# Patient Record
Sex: Male | Born: 1955 | Race: White | Hispanic: No | Smoking: Current every day smoker
Health system: Southern US, Community
[De-identification: ages and names within clinical notes are randomized; demographics above are authoritative.]

## PROBLEM LIST (undated history)

## (undated) DIAGNOSIS — I739 Peripheral vascular disease, unspecified: Secondary | ICD-10-CM

## (undated) DIAGNOSIS — K8020 Calculus of gallbladder without cholecystitis without obstruction: Secondary | ICD-10-CM

## (undated) DIAGNOSIS — L570 Actinic keratosis: Secondary | ICD-10-CM

## (undated) DIAGNOSIS — K219 Gastro-esophageal reflux disease without esophagitis: Secondary | ICD-10-CM

## (undated) DIAGNOSIS — R918 Other nonspecific abnormal finding of lung field: Secondary | ICD-10-CM

## (undated) HISTORY — PX: TONSILLECTOMY: SUR1361

## (undated) HISTORY — PX: COLON SURGERY: SHX602

---

## 1985-07-13 HISTORY — PX: OTHER SURGICAL HISTORY: SHX169

## 1985-07-13 HISTORY — PX: ILEOCECETOMY: SHX5857

## 1985-07-13 HISTORY — PX: TAKE DOWN OF INTESTINAL FISTULA: SHX6107

## 1995-07-14 HISTORY — PX: PARTIAL COLECTOMY: SHX5273

## 2001-02-15 ENCOUNTER — Ambulatory Visit (HOSPITAL_COMMUNITY): Admission: RE | Admit: 2001-02-15 | Discharge: 2001-02-15 | Payer: Self-pay | Admitting: Gastroenterology

## 2001-03-01 ENCOUNTER — Encounter (HOSPITAL_COMMUNITY): Admission: RE | Admit: 2001-03-01 | Discharge: 2001-05-30 | Payer: Self-pay | Admitting: Gastroenterology

## 2002-10-25 ENCOUNTER — Encounter (HOSPITAL_COMMUNITY): Admission: RE | Admit: 2002-10-25 | Discharge: 2003-01-23 | Payer: Self-pay | Admitting: Gastroenterology

## 2003-03-28 ENCOUNTER — Encounter (HOSPITAL_COMMUNITY): Admission: RE | Admit: 2003-03-28 | Discharge: 2003-06-26 | Payer: Self-pay | Admitting: Gastroenterology

## 2016-10-06 DIAGNOSIS — Z13220 Encounter for screening for lipoid disorders: Secondary | ICD-10-CM | POA: Diagnosis not present

## 2016-10-06 DIAGNOSIS — E538 Deficiency of other specified B group vitamins: Secondary | ICD-10-CM | POA: Diagnosis not present

## 2016-10-06 DIAGNOSIS — Z23 Encounter for immunization: Secondary | ICD-10-CM | POA: Diagnosis not present

## 2016-10-06 DIAGNOSIS — Z0001 Encounter for general adult medical examination with abnormal findings: Secondary | ICD-10-CM | POA: Diagnosis not present

## 2016-10-20 ENCOUNTER — Other Ambulatory Visit: Payer: Self-pay | Admitting: Acute Care

## 2016-10-20 DIAGNOSIS — F17210 Nicotine dependence, cigarettes, uncomplicated: Principal | ICD-10-CM

## 2016-10-26 DIAGNOSIS — L570 Actinic keratosis: Secondary | ICD-10-CM | POA: Diagnosis not present

## 2016-10-26 DIAGNOSIS — L821 Other seborrheic keratosis: Secondary | ICD-10-CM | POA: Diagnosis not present

## 2016-10-30 ENCOUNTER — Ambulatory Visit: Payer: Self-pay

## 2016-10-30 ENCOUNTER — Encounter: Payer: Self-pay | Admitting: Acute Care

## 2016-11-11 ENCOUNTER — Ambulatory Visit (INDEPENDENT_AMBULATORY_CARE_PROVIDER_SITE_OTHER)
Admission: RE | Admit: 2016-11-11 | Discharge: 2016-11-11 | Disposition: A | Discharge status: Home / Self Care | Payer: BLUE CROSS/BLUE SHIELD | Source: Ambulatory Visit | Attending: Acute Care | Admitting: Acute Care

## 2016-11-11 ENCOUNTER — Ambulatory Visit (INDEPENDENT_AMBULATORY_CARE_PROVIDER_SITE_OTHER): Payer: BLUE CROSS/BLUE SHIELD | Admitting: Acute Care

## 2016-11-11 ENCOUNTER — Other Ambulatory Visit: Payer: Self-pay | Admitting: Acute Care

## 2016-11-11 ENCOUNTER — Encounter: Payer: Self-pay | Admitting: Acute Care

## 2016-11-11 DIAGNOSIS — F17210 Nicotine dependence, cigarettes, uncomplicated: Secondary | ICD-10-CM

## 2016-11-11 DIAGNOSIS — Z87891 Personal history of nicotine dependence: Secondary | ICD-10-CM

## 2016-11-11 NOTE — Progress Notes (Signed)
Shared Decision Making Visit Lung Cancer Screening Program [id number])   Eligibility:  Age 61 y.o.  Pack Years Smoking History Calculation 43.5 pack year smoking history (# packs/per year x # years smoked)  Recent History of coughing up blood  no  Unexplained weight loss? no ( >Than 15 pounds within the last 6 months )  Prior History Lung / other cancer no (Diagnosis within the last 5 years already requiring surveillance chest CT Scans).  Smoking Status Current Smoker  Former Smokers: Years since quit: Not applicable  Quit Date: Not applicable  Visit Components:  Discussion included one or more decision making aids. yes  Discussion included risk/benefits of screening. yes  Discussion included potential follow up diagnostic testing for abnormal scans. yes  Discussion included meaning and risk of over diagnosis. yes  Discussion included meaning and risk of False Positives. yes  Discussion included meaning of total radiation exposure. yes  Counseling Included:  Importance of adherence to annual lung cancer LDCT screening. yes  Impact of comorbidities on ability to participate in the program. yes  Ability and willingness to under diagnostic treatment. yes  Smoking Cessation Counseling:  Current Smokers:   Discussed importance of smoking cessation. yes  Information about tobacco cessation classes and interventions provided to patient. yes  Patient provided with "ticket" for LDCT Scan. yes  Symptomatic Patient. no  Counseling not applicable  Diagnosis Code: Tobacco Use Z72.0  Asymptomatic Patient yes  Counseling (Intermediate counseling: > three minutes counseling) U0454  Former Smokers:   Discussed the importance of maintaining cigarette abstinence. yes  Diagnosis Code: Personal History of Nicotine Dependence. U98.119  Information about tobacco cessation classes and interventions provided to patient. Yes  Patient provided with "ticket" for LDCT Scan.  yes  Written Order for Lung Cancer Screening with LDCT placed in Epic. Yes (CT Chest Lung Cancer Screening Low Dose W/O CM) [id number] Z12.2-Screening of respiratory organs Z87.891-Personal history of nicotine dependence  I have spent 25 minutes of face to face time with Calvin Brooks discussing the risks and benefits of lung cancer screening. We viewed a power point together that explained in detail the above noted topics. We paused at intervals to allow for questions to be asked and answered to ensure understanding.We discussed that the single most powerful action that he can take to decrease his risk of developing lung cancer is to quit smoking. We discussed whether or not he is ready to commit to setting a quit date. He is currently not ready to set a quit date. We discussed options for tools to aid in quitting smoking including nicotine replacement therapy, non-nicotine medications, support groups, Quit Smart classes, and behavior modification. We discussed that often times setting smaller, more achievable goals, such as eliminating 1 cigarette a day for a week and then 2 cigarettes a day for a week can be helpful in slowly decreasing the number of cigarettes smoked. This allows for a sense of accomplishment as well as providing a clinical benefit. I gave Calvin Brooks the " Be Stronger Than Your Excuses" card with contact information for community resources, classes, free nicotine replacement therapy, and access to mobile apps, text messaging, and on-line smoking cessation help. I have also given him my card and contact information in the event he needs to contact me. We discussed the time and location of the scan, and that either Abigail Miyamoto RN or I will call with the results once we receive them. I have offered Calvin Brooks  a copy of the power  point we viewed  as a resource in the event they need reinforcement of the concepts we discussed today in the office. The patient verbalized understanding of all of   the above and had no further questions upon leaving the office. They have my contact information in the event they have any further questions.  I spent 4 minutes counseling on smoking cessation and the health risks of continued tobacco abuse.  I explained to the patient that there has been a high incidence of coronary artery disease noted on these exams. I explained that this is a non-gated exam therefore degree or severity cannot be determined. This patient is currently on cholestyramine , but not statin therapy for elevated cholesterol I have asked the patient to follow-up with their PCP regarding any incidental finding of coronary artery disease and management with diet or medication as their PCP  feels is clinically indicated. The patient verbalized understanding of the above and had no further questions upon completion of the visit.       Bevelyn Ngo, NP 11/11/2016

## 2016-11-12 DIAGNOSIS — E538 Deficiency of other specified B group vitamins: Secondary | ICD-10-CM | POA: Diagnosis not present

## 2016-11-12 DIAGNOSIS — K5000 Crohn's disease of small intestine without complications: Secondary | ICD-10-CM | POA: Diagnosis not present

## 2016-11-13 ENCOUNTER — Other Ambulatory Visit: Payer: Self-pay | Admitting: Gastroenterology

## 2016-12-16 ENCOUNTER — Encounter (HOSPITAL_COMMUNITY): Payer: Self-pay | Admitting: *Deleted

## 2016-12-21 ENCOUNTER — Ambulatory Visit (HOSPITAL_COMMUNITY): Payer: BLUE CROSS/BLUE SHIELD | Admitting: Anesthesiology

## 2016-12-21 ENCOUNTER — Encounter (HOSPITAL_COMMUNITY)
Admission: RE | Disposition: A | Discharge status: Home / Self Care | Payer: Self-pay | Source: Ambulatory Visit | Attending: Gastroenterology

## 2016-12-21 ENCOUNTER — Encounter (HOSPITAL_COMMUNITY): Payer: Self-pay | Admitting: Anesthesiology

## 2016-12-21 ENCOUNTER — Ambulatory Visit (HOSPITAL_COMMUNITY)
Admission: RE | Admit: 2016-12-21 | Discharge: 2016-12-21 | Disposition: A | Discharge status: Home / Self Care | Payer: BLUE CROSS/BLUE SHIELD | Source: Ambulatory Visit | Attending: Gastroenterology | Admitting: Gastroenterology

## 2016-12-21 DIAGNOSIS — E538 Deficiency of other specified B group vitamins: Secondary | ICD-10-CM | POA: Diagnosis not present

## 2016-12-21 DIAGNOSIS — K50813 Crohn's disease of both small and large intestine with fistula: Secondary | ICD-10-CM | POA: Diagnosis not present

## 2016-12-21 DIAGNOSIS — Z880 Allergy status to penicillin: Secondary | ICD-10-CM | POA: Diagnosis not present

## 2016-12-21 DIAGNOSIS — K5000 Crohn's disease of small intestine without complications: Secondary | ICD-10-CM | POA: Diagnosis not present

## 2016-12-21 DIAGNOSIS — F17210 Nicotine dependence, cigarettes, uncomplicated: Secondary | ICD-10-CM | POA: Insufficient documentation

## 2016-12-21 DIAGNOSIS — Z1211 Encounter for screening for malignant neoplasm of colon: Secondary | ICD-10-CM | POA: Diagnosis not present

## 2016-12-21 DIAGNOSIS — K219 Gastro-esophageal reflux disease without esophagitis: Secondary | ICD-10-CM | POA: Diagnosis not present

## 2016-12-21 HISTORY — PX: COLONOSCOPY WITH PROPOFOL: SHX5780

## 2016-12-21 HISTORY — DX: Gastro-esophageal reflux disease without esophagitis: K21.9

## 2016-12-21 HISTORY — DX: Other nonspecific abnormal finding of lung field: R91.8

## 2016-12-21 HISTORY — DX: Actinic keratosis: L57.0

## 2016-12-21 HISTORY — DX: Calculus of gallbladder without cholecystitis without obstruction: K80.20

## 2016-12-21 HISTORY — DX: Peripheral vascular disease, unspecified: I73.9

## 2016-12-21 SURGERY — COLONOSCOPY WITH PROPOFOL
Anesthesia: Monitor Anesthesia Care

## 2016-12-21 MED ORDER — MIDAZOLAM HCL 2 MG/2ML IJ SOLN
INTRAMUSCULAR | Status: AC
  Filled 2016-12-21: qty 2

## 2016-12-21 MED ORDER — PROPOFOL 10 MG/ML IV BOLUS
INTRAVENOUS | Status: AC
  Filled 2016-12-21: qty 20

## 2016-12-21 MED ORDER — PROPOFOL 500 MG/50ML IV EMUL
INTRAVENOUS | Status: DC | PRN
  Administered 2016-12-21: 125 ug/kg/min via INTRAVENOUS

## 2016-12-21 MED ORDER — LACTATED RINGERS IV SOLN
INTRAVENOUS | Status: DC
  Administered 2016-12-21: 13:00:00 via INTRAVENOUS

## 2016-12-21 MED ORDER — PROPOFOL 500 MG/50ML IV EMUL
INTRAVENOUS | Status: DC | PRN
  Administered 2016-12-21: 50 mg via INTRAVENOUS

## 2016-12-21 MED ORDER — MIDAZOLAM HCL 5 MG/5ML IJ SOLN
INTRAMUSCULAR | Status: DC | PRN
  Administered 2016-12-21: 2 mg via INTRAVENOUS

## 2016-12-21 MED ORDER — SODIUM CHLORIDE 0.9 % IV SOLN: INTRAVENOUS | Status: DC

## 2016-12-21 SURGICAL SUPPLY — 21 items

## 2016-12-21 NOTE — Op Note (Signed)
[facility] Patient Name: Calvin Brooks Procedure Date: 12/21/2016 MRN: [id number] Attending MD: Charolett Bumpers , MD Date of Birth: 02-28-1956 CSN: [id number] Age: 61 Admit Type: Outpatient Procedure:                Colonoscopy Indications:              Screening for colorectal malignant neoplasm.                            Crohn's ileocolitis was diagnosed in 43. 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 Terminal ilial resection was performed in                            1998. Normal screening colonoscopy was performed in                            1987. Mercaptopurine was stopped due to elevated                            liver transaminases. Vitamin B 12 deficiency. Providers:                Charolett Bumpers, MD, Omelia Blackwater RN, RN,                            Zoila Shutter, Technician Referring MD:              Medicines:                Propofol per Anesthesia Complications:            No immediate complications. Estimated Blood Loss:     Estimated blood loss: none. Procedure:                Pre-Anesthesia Assessment:                           - Prior to the procedure, a History and Physical                            was performed, and patient medications and                            allergies were reviewed. The patient's tolerance of                            previous anesthesia was also reviewed. The risks  and benefits of the procedure and the sedation                            options and risks were discussed with the patient.                            All questions were answered, and informed consent                            was obtained. Prior Anticoagulants: The patient has                            taken no previous anticoagulant or antiplatelet                agents. ASA Grade Assessment: II - A patient with                            mild systemic disease. After reviewing the risks                            and benefits, the patient was deemed in                            satisfactory condition to undergo the procedure.                           After obtaining informed consent, the colonoscope                            was passed under direct vision. Throughout the                            procedure, the patient's blood pressure, pulse, and                            oxygen saturations were monitored continuously. The                            [id number] ([id number]) scope was introduced through                            the anus and advanced to the the ileocolonic                            anastomosis. The colonoscopy was performed without                            difficulty. The patient tolerated the procedure                            well. The quality of the bowel preparation was                            good. The  rectum was photographed. Scope In: 1:31:43 PM Scope Out: 1:52:55 PM Total Procedure Duration: 0 hours 21 minutes 12 seconds  Findings:      The perianal and digital rectal examinations were normal.      The entire examined colon appeared normal. The ileo-right colonic       surgical anastomosis was severely stenosed. I could not traverse the       strictured surgical anastomosis to examine the neoterminal ileum. Impression:               - The entire examined colon is normal. The                            ileo-right colonic surgical anastomosis is severely                            stenosed.                           - No specimens collected. Moderate Sedation:      N/A- Per Anesthesia Care Recommendation:           - Patient has a contact number available for                            emergencies. The signs and symptoms of potential                            delayed complications were discussed with  the                            patient. Return to normal activities tomorrow.                            Written discharge instructions were provided to the                            patient.                           - Repeat colonoscopy in 10 years for screening                            purposes.                           - Resume previous diet.                           - Continue present medications. Procedure Code(s):        --- Professional ---                           Z6109, Colorectal cancer screening; colonoscopy on                            individual not meeting criteria for high risk Diagnosis Code(s):        --- Professional ---  Z12.11, Encounter for screening for malignant                            neoplasm of colon CPT copyright 2016 [employer]. All rights reserved. The codes documented in this report are preliminary and upon coder review may  be revised to meet current compliance requirements. Calvin EdgeMartin Johnson, MD Charolett BumpersMartin K Johnson, MD 12/21/2016 2:00:51 PM This report has been signed electronically. Number of Addenda: 0

## 2016-12-21 NOTE — Transfer of Care (Signed)
Immediate Anesthesia Transfer of Care Note  Patient: Calvin Brooks  Procedure(s) Performed: Procedure(s): COLONOSCOPY WITH PROPOFOL (N/A)  Patient Location: PACU  Anesthesia Type:MAC  Level of Consciousness:  sedated, patient cooperative and responds to stimulation  Airway & Oxygen Therapy:Patient Spontanous Breathing and Patient connected to face mask oxgen  Post-op Assessment:  Report given to PACU RN and Post -op Vital signs reviewed and stable  Post vital signs:  Reviewed and stable  Last Vitals:  Vitals:   12/21/16 1235  BP: (!) 156/89  Pulse: 90  Resp: 12  Temp: 92.4 C    Complications: No apparent anesthesia complications

## 2016-12-21 NOTE — Anesthesia Preprocedure Evaluation (Addendum)
Anesthesia Evaluation  Patient identified by MRN, date of birth, ID band Patient awake    Reviewed: Allergy & Precautions, NPO status , Patient's Chart, lab work & pertinent test results  Airway Mallampati: II  TM Distance: >3 FB Neck ROM: Full    Dental no notable dental hx. (+) Dental Advisory Given   Pulmonary Current Smoker,    Pulmonary exam normal        Cardiovascular negative cardio ROS Normal cardiovascular exam     Neuro/Psych negative neurological ROS  negative psych ROS   GI/Hepatic Neg liver ROS, GERD  ,  Endo/Other  negative endocrine ROS  Renal/GU negative Renal ROS  negative genitourinary   Musculoskeletal negative musculoskeletal ROS (+)   Abdominal   Peds negative pediatric ROS (+)  Hematology negative hematology ROS (+)   Anesthesia Other Findings   Reproductive/Obstetrics negative OB ROS                            Anesthesia Physical Anesthesia Plan  ASA: II  Anesthesia Plan: MAC   Post-op Pain Management:    Induction:   PONV Risk Score and Plan:   Airway Management Planned: Natural Airway and Simple Face Mask  Additional Equipment:   Intra-op Plan:   Post-operative Plan:   Informed Consent: I have reviewed the patients History and Physical, chart, labs and discussed the procedure including the risks, benefits and alternatives for the proposed anesthesia with the patient or authorized representative who has indicated his/her understanding and acceptance.   Dental advisory given  Plan Discussed with: CRNA, Anesthesiologist and Surgeon  Anesthesia Plan Comments:        Anesthesia Quick Evaluation

## 2016-12-21 NOTE — Anesthesia Postprocedure Evaluation (Signed)
Anesthesia Post Note  Patient: Calvin Brooks  Procedure(s) Performed: Procedure(s) (LRB): COLONOSCOPY WITH PROPOFOL (N/A)     Patient location during evaluation: Endoscopy Anesthesia Type: MAC Level of consciousness: awake and alert Pain management: pain level controlled Vital Signs Assessment: post-procedure vital signs reviewed and stable Respiratory status: spontaneous breathing and respiratory function stable Cardiovascular status: stable Anesthetic complications: no    Last Vitals:  Vitals:   12/21/16 1410 12/21/16 1420  BP: (!) 138/99 134/60  Pulse: 86 91  Resp: 20 14  Temp:      Last Pain:  Vitals:   12/21/16 1359  TempSrc: Oral                 Kaiden Dardis DANIEL

## 2016-12-21 NOTE — Discharge Instructions (Signed)

## 2016-12-21 NOTE — H&P (Signed)
Procedure: Screening colonoscopy. Crohn's ileitis was diagnosed in [address]1987. Normal screening colonoscopy was performed in 1987. Terminal ileum and cecal resection was performed in 1987 to treat Crohn's ileitis. Terminal ilial resection was performed in 1998. Mercaptopurine was stopped due to elevated liver transaminases. Vitamin B 12 deficiency. 2.6 cm gallstone.  History: The patient is a 61 year old male born Aug 27, 1955. He was diagnosed with Crohn's ileitis in 1987. He no longer takes maintenance therapy for Crohn's disease. He takes cholestyramine to prevent diarrhea.  He is scheduled to undergo a screening colonoscopy today.  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 Vitamin B 12 deficiency. Tonsillectomy. Terminal ileum and cecum removed 1987. Terminal ilial resection performed in 1998.  Medication allergies: Penicillin  Exam: The patient is alert and lying comfortably on the endoscopy stretcher. Abdomen is soft and nontender to palpation. Lungs are clear to auscultation. Cardiac exam reveals a regular rhythm.  Plan: Proceed with screening colonoscopy

## 2016-12-22 ENCOUNTER — Encounter (HOSPITAL_COMMUNITY): Payer: Self-pay | Admitting: Gastroenterology

## 2017-06-08 ENCOUNTER — Other Ambulatory Visit: Payer: Self-pay | Admitting: Gastroenterology

## 2017-06-08 DIAGNOSIS — K5000 Crohn's disease of small intestine without complications: Secondary | ICD-10-CM | POA: Diagnosis not present

## 2017-06-08 DIAGNOSIS — K50019 Crohn's disease of small intestine with unspecified complications: Secondary | ICD-10-CM

## 2017-06-14 ENCOUNTER — Other Ambulatory Visit: Payer: BLUE CROSS/BLUE SHIELD

## 2017-06-14 ENCOUNTER — Ambulatory Visit
Admission: RE | Admit: 2017-06-14 | Discharge: 2017-06-14 | Disposition: A | Discharge status: Home / Self Care | Payer: BLUE CROSS/BLUE SHIELD | Source: Ambulatory Visit | Attending: Gastroenterology | Admitting: Gastroenterology

## 2017-06-14 DIAGNOSIS — K8020 Calculus of gallbladder without cholecystitis without obstruction: Secondary | ICD-10-CM | POA: Diagnosis not present

## 2017-06-14 DIAGNOSIS — K50019 Crohn's disease of small intestine with unspecified complications: Secondary | ICD-10-CM

## 2017-06-14 MED ORDER — IOPAMIDOL (ISOVUE-300) INJECTION 61%
125.0000 mL | Freq: Once | INTRAVENOUS | Status: AC | PRN
  Administered 2017-06-14: 125 mL via INTRAVENOUS

## 2017-11-12 ENCOUNTER — Ambulatory Visit (INDEPENDENT_AMBULATORY_CARE_PROVIDER_SITE_OTHER)
Admission: RE | Admit: 2017-11-12 | Discharge: 2017-11-12 | Disposition: A | Discharge status: Home / Self Care | Payer: BLUE CROSS/BLUE SHIELD | Source: Ambulatory Visit | Attending: Acute Care | Admitting: Acute Care

## 2017-11-12 DIAGNOSIS — F17210 Nicotine dependence, cigarettes, uncomplicated: Secondary | ICD-10-CM

## 2017-11-12 DIAGNOSIS — Z87891 Personal history of nicotine dependence: Secondary | ICD-10-CM | POA: Diagnosis not present

## 2017-11-16 DIAGNOSIS — K5000 Crohn's disease of small intestine without complications: Secondary | ICD-10-CM | POA: Diagnosis not present

## 2017-11-26 ENCOUNTER — Other Ambulatory Visit: Payer: Self-pay | Admitting: Acute Care

## 2017-11-26 DIAGNOSIS — F17210 Nicotine dependence, cigarettes, uncomplicated: Principal | ICD-10-CM

## 2017-11-26 DIAGNOSIS — Z122 Encounter for screening for malignant neoplasm of respiratory organs: Secondary | ICD-10-CM

## 2018-03-07 ENCOUNTER — Other Ambulatory Visit: Payer: Self-pay | Admitting: Gastroenterology

## 2018-03-07 DIAGNOSIS — K5000 Crohn's disease of small intestine without complications: Secondary | ICD-10-CM | POA: Diagnosis not present

## 2018-03-07 DIAGNOSIS — R634 Abnormal weight loss: Secondary | ICD-10-CM

## 2018-03-07 DIAGNOSIS — K50019 Crohn's disease of small intestine with unspecified complications: Secondary | ICD-10-CM

## 2018-03-15 ENCOUNTER — Ambulatory Visit
Admission: RE | Admit: 2018-03-15 | Discharge: 2018-03-15 | Disposition: A | Discharge status: Home / Self Care | Payer: BLUE CROSS/BLUE SHIELD | Source: Ambulatory Visit | Attending: Gastroenterology | Admitting: Gastroenterology

## 2018-03-15 DIAGNOSIS — K8020 Calculus of gallbladder without cholecystitis without obstruction: Secondary | ICD-10-CM | POA: Diagnosis not present

## 2018-03-15 DIAGNOSIS — R634 Abnormal weight loss: Secondary | ICD-10-CM

## 2018-03-15 DIAGNOSIS — K50019 Crohn's disease of small intestine with unspecified complications: Secondary | ICD-10-CM

## 2018-03-15 MED ORDER — IOPAMIDOL (ISOVUE-300) INJECTION 61%
100.0000 mL | Freq: Once | INTRAVENOUS | Status: AC | PRN
  Administered 2018-03-15: 100 mL via INTRAVENOUS

## 2018-05-25 ENCOUNTER — Ambulatory Visit (INDEPENDENT_AMBULATORY_CARE_PROVIDER_SITE_OTHER)
Admission: RE | Admit: 2018-05-25 | Discharge: 2018-05-25 | Disposition: A | Discharge status: Home / Self Care | Payer: BLUE CROSS/BLUE SHIELD | Source: Ambulatory Visit | Attending: Acute Care | Admitting: Acute Care

## 2018-05-25 DIAGNOSIS — Z122 Encounter for screening for malignant neoplasm of respiratory organs: Secondary | ICD-10-CM

## 2018-05-25 DIAGNOSIS — F17210 Nicotine dependence, cigarettes, uncomplicated: Secondary | ICD-10-CM

## 2018-05-25 DIAGNOSIS — R911 Solitary pulmonary nodule: Secondary | ICD-10-CM | POA: Diagnosis not present

## 2018-06-02 ENCOUNTER — Telehealth: Payer: Self-pay | Admitting: Acute Care

## 2018-06-02 DIAGNOSIS — Z122 Encounter for screening for malignant neoplasm of respiratory organs: Secondary | ICD-10-CM

## 2018-06-02 DIAGNOSIS — F17210 Nicotine dependence, cigarettes, uncomplicated: Principal | ICD-10-CM

## 2018-06-02 DIAGNOSIS — Z87891 Personal history of nicotine dependence: Secondary | ICD-10-CM

## 2018-06-03 NOTE — Telephone Encounter (Signed)
Pt informed of CT results per Kandice RobinsonsSarah Groce, NP.  PT verbalized understanding.  Copy sent to PCP.  Order placed for 1 yr f/u CT. Copy mailed to pt.

## 2018-06-13 DIAGNOSIS — K5000 Crohn's disease of small intestine without complications: Secondary | ICD-10-CM | POA: Diagnosis not present

## 2018-06-30 DIAGNOSIS — K5000 Crohn's disease of small intestine without complications: Secondary | ICD-10-CM | POA: Diagnosis not present

## 2018-08-29 DIAGNOSIS — K5000 Crohn's disease of small intestine without complications: Secondary | ICD-10-CM | POA: Diagnosis not present

## 2019-01-30 DIAGNOSIS — Z0120 Encounter for dental examination and cleaning without abnormal findings: Secondary | ICD-10-CM | POA: Diagnosis not present

## 2019-03-03 ENCOUNTER — Other Ambulatory Visit: Payer: Self-pay

## 2019-03-03 DIAGNOSIS — Z20822 Contact with and (suspected) exposure to covid-19: Secondary | ICD-10-CM

## 2019-03-04 LAB — NOVEL CORONAVIRUS, NAA: SARS-CoV-2, NAA: NOT DETECTED

## 2019-03-07 ENCOUNTER — Telehealth: Payer: Self-pay | Admitting: General Practice

## 2019-03-07 NOTE — Telephone Encounter (Signed)
Patient informed of negative covid-19 result. Patient verbalized understanding.  °

## 2019-06-05 DIAGNOSIS — Z0120 Encounter for dental examination and cleaning without abnormal findings: Secondary | ICD-10-CM | POA: Diagnosis not present

## 2019-06-14 ENCOUNTER — Ambulatory Visit
Admission: RE | Admit: 2019-06-14 | Discharge: 2019-06-14 | Disposition: A | Discharge status: Home / Self Care | Payer: BLUE CROSS/BLUE SHIELD | Source: Ambulatory Visit | Attending: Acute Care | Admitting: Acute Care

## 2019-06-14 DIAGNOSIS — F17210 Nicotine dependence, cigarettes, uncomplicated: Secondary | ICD-10-CM | POA: Diagnosis not present

## 2019-06-14 DIAGNOSIS — Z87891 Personal history of nicotine dependence: Secondary | ICD-10-CM

## 2019-06-14 DIAGNOSIS — Z122 Encounter for screening for malignant neoplasm of respiratory organs: Secondary | ICD-10-CM

## 2019-06-15 DIAGNOSIS — K50012 Crohn's disease of small intestine with intestinal obstruction: Secondary | ICD-10-CM | POA: Diagnosis not present

## 2019-06-16 DIAGNOSIS — K50012 Crohn's disease of small intestine with intestinal obstruction: Secondary | ICD-10-CM | POA: Diagnosis not present

## 2019-06-19 ENCOUNTER — Other Ambulatory Visit: Payer: Self-pay | Admitting: *Deleted

## 2019-06-19 DIAGNOSIS — Z87891 Personal history of nicotine dependence: Secondary | ICD-10-CM

## 2019-06-19 DIAGNOSIS — F17210 Nicotine dependence, cigarettes, uncomplicated: Secondary | ICD-10-CM

## 2019-06-19 DIAGNOSIS — Z122 Encounter for screening for malignant neoplasm of respiratory organs: Secondary | ICD-10-CM

## 2019-10-02 DIAGNOSIS — Z0120 Encounter for dental examination and cleaning without abnormal findings: Secondary | ICD-10-CM | POA: Diagnosis not present

## 2020-02-05 DIAGNOSIS — Z0120 Encounter for dental examination and cleaning without abnormal findings: Secondary | ICD-10-CM | POA: Diagnosis not present

## 2020-06-03 DIAGNOSIS — Z8719 Personal history of other diseases of the digestive system: Secondary | ICD-10-CM | POA: Diagnosis not present

## 2020-06-03 DIAGNOSIS — Z0120 Encounter for dental examination and cleaning without abnormal findings: Secondary | ICD-10-CM | POA: Diagnosis not present

## 2020-06-03 DIAGNOSIS — E538 Deficiency of other specified B group vitamins: Secondary | ICD-10-CM | POA: Diagnosis not present

## 2020-06-20 ENCOUNTER — Ambulatory Visit
Admission: RE | Admit: 2020-06-20 | Discharge: 2020-06-20 | Disposition: A | Discharge status: Home / Self Care | Payer: Medicare Other | Source: Ambulatory Visit | Attending: Acute Care | Admitting: Acute Care

## 2020-06-20 DIAGNOSIS — F17210 Nicotine dependence, cigarettes, uncomplicated: Secondary | ICD-10-CM | POA: Diagnosis not present

## 2020-06-20 DIAGNOSIS — J432 Centrilobular emphysema: Secondary | ICD-10-CM | POA: Diagnosis not present

## 2020-06-20 DIAGNOSIS — D3500 Benign neoplasm of unspecified adrenal gland: Secondary | ICD-10-CM | POA: Diagnosis not present

## 2020-06-20 DIAGNOSIS — I2510 Atherosclerotic heart disease of native coronary artery without angina pectoris: Secondary | ICD-10-CM | POA: Diagnosis not present

## 2020-06-20 DIAGNOSIS — Z87891 Personal history of nicotine dependence: Secondary | ICD-10-CM

## 2020-06-20 DIAGNOSIS — Z122 Encounter for screening for malignant neoplasm of respiratory organs: Secondary | ICD-10-CM

## 2020-06-27 ENCOUNTER — Other Ambulatory Visit: Payer: Self-pay | Admitting: *Deleted

## 2020-06-27 DIAGNOSIS — F17210 Nicotine dependence, cigarettes, uncomplicated: Secondary | ICD-10-CM

## 2020-06-27 DIAGNOSIS — Z87891 Personal history of nicotine dependence: Secondary | ICD-10-CM

## 2020-06-27 NOTE — Progress Notes (Signed)
Please call patient and let them  know their  low dose Ct was read as a Lung RADS 2: nodules that are benign in appearance and behavior with a very low likelihood of becoming a clinically active cancer due to size or lack of growth. Recommendation per radiology is for a repeat LDCT in 12 months. .Please let them  know we will order and schedule their  annual screening scan for 06/2021. Please let them  know there was notation of CAD on their  scan.  Please remind the patient  that this is a non-gated exam therefore degree or severity of disease  cannot be determined. Please have them  follow up with their PCP regarding potential risk factor modification, dietary therapy or pharmacologic therapy if clinically indicated. Pt.  is  not currently on statin therapy. Please place order for annual  screening scan for  06/2021 and fax results to PCP. Thanks so much.  Angelique Blonder, this patient has significant CAD per scan. They are not on statin, and I do not see any cardiology notes within Epic. Marland Kitchen Please Have them follow up with their PCP about referral to cards. Thanks so much

## 2020-11-27 ENCOUNTER — Other Ambulatory Visit (HOSPITAL_COMMUNITY): Payer: Self-pay | Admitting: Gastroenterology

## 2020-11-27 ENCOUNTER — Other Ambulatory Visit: Payer: Self-pay | Admitting: Gastroenterology

## 2020-11-27 DIAGNOSIS — R110 Nausea: Secondary | ICD-10-CM | POA: Diagnosis not present

## 2020-11-27 DIAGNOSIS — K50012 Crohn's disease of small intestine with intestinal obstruction: Secondary | ICD-10-CM | POA: Diagnosis not present

## 2020-11-27 DIAGNOSIS — R1084 Generalized abdominal pain: Secondary | ICD-10-CM | POA: Diagnosis not present

## 2020-12-03 ENCOUNTER — Other Ambulatory Visit: Payer: Self-pay

## 2020-12-03 ENCOUNTER — Ambulatory Visit (HOSPITAL_COMMUNITY)
Admission: RE | Admit: 2020-12-03 | Discharge: 2020-12-03 | Disposition: A | Discharge status: Home / Self Care | Payer: Medicare Other | Source: Ambulatory Visit | Attending: Gastroenterology | Admitting: Gastroenterology

## 2020-12-03 DIAGNOSIS — K50012 Crohn's disease of small intestine with intestinal obstruction: Secondary | ICD-10-CM | POA: Insufficient documentation

## 2020-12-03 DIAGNOSIS — R1084 Generalized abdominal pain: Secondary | ICD-10-CM | POA: Diagnosis not present

## 2020-12-03 DIAGNOSIS — R109 Unspecified abdominal pain: Secondary | ICD-10-CM | POA: Diagnosis not present

## 2020-12-03 MED ORDER — IOHEXOL 300 MG/ML  SOLN
100.0000 mL | Freq: Once | INTRAMUSCULAR | Status: AC | PRN
  Administered 2020-12-03: 100 mL via INTRAVENOUS

## 2020-12-03 MED ORDER — BARIUM SULFATE 0.1 % PO SUSP
ORAL | Status: AC
  Filled 2020-12-03: qty 3

## 2020-12-03 MED ORDER — SODIUM CHLORIDE (PF) 0.9 % IJ SOLN
INTRAMUSCULAR | Status: AC
  Filled 2020-12-03: qty 50

## 2020-12-19 DIAGNOSIS — K50012 Crohn's disease of small intestine with intestinal obstruction: Secondary | ICD-10-CM | POA: Diagnosis not present

## 2020-12-26 DIAGNOSIS — K50012 Crohn's disease of small intestine with intestinal obstruction: Secondary | ICD-10-CM | POA: Diagnosis not present

## 2021-01-02 DIAGNOSIS — K50012 Crohn's disease of small intestine with intestinal obstruction: Secondary | ICD-10-CM | POA: Diagnosis not present

## 2021-01-09 DIAGNOSIS — K50012 Crohn's disease of small intestine with intestinal obstruction: Secondary | ICD-10-CM | POA: Diagnosis not present

## 2021-01-16 DIAGNOSIS — K50012 Crohn's disease of small intestine with intestinal obstruction: Secondary | ICD-10-CM | POA: Diagnosis not present

## 2021-02-04 DIAGNOSIS — K50012 Crohn's disease of small intestine with intestinal obstruction: Secondary | ICD-10-CM | POA: Diagnosis not present

## 2021-03-10 DIAGNOSIS — K50012 Crohn's disease of small intestine with intestinal obstruction: Secondary | ICD-10-CM | POA: Diagnosis not present

## 2021-04-10 DIAGNOSIS — K5000 Crohn's disease of small intestine without complications: Secondary | ICD-10-CM | POA: Diagnosis not present

## 2021-09-06 IMAGING — CT CT CHEST LUNG CANCER SCREENING LOW DOSE W/O CM
2 of 5 series · 14 of 40 positions shown, 17 images · non-contrast
Comparison: Low-dose lung cancer screening chest CT 06/14/2019.

CLINICAL DATA: 64-year-old male current smoker with 46 pack-year
history of smoking. Lung cancer screening examination.

EXAM:
CT CHEST WITHOUT CONTRAST LOW-DOSE FOR LUNG CANCER SCREENING
TECHNIQUE: Multidetector CT imaging of the chest was performed following the
standard protocol without IV contrast.

[Series 4: lung 1.00 br44 cor · coronal · 0.72mm/px · 3 of 317 slices shown]
[im 64/317  lung]
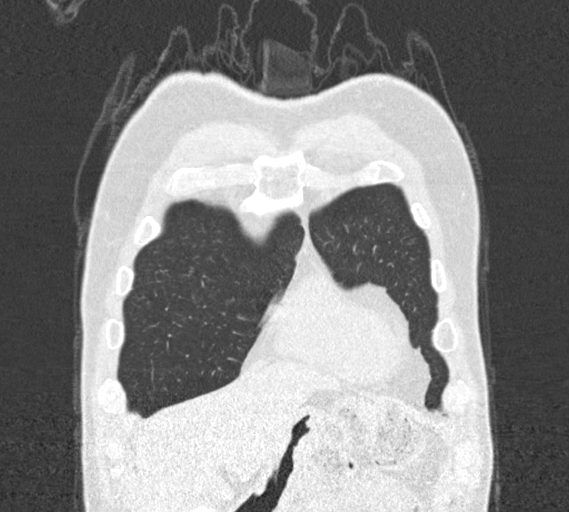
[im 127/317  lung]
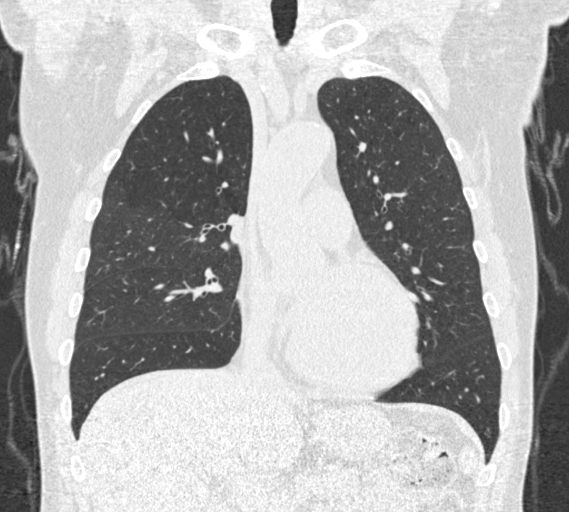
[im 190/317  lung]
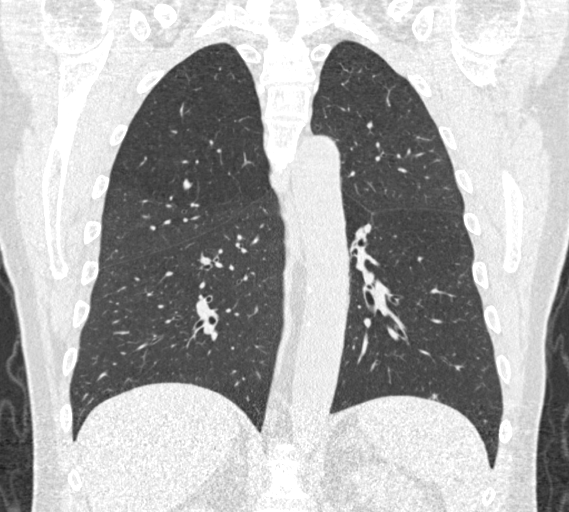

[Series 9: lung 1.00 br60 axial · axial · 0.80mm/px · z∈[-931,-599]mm · 11 of 368 slices shown, 14 images]
[im 18/368  mediastinal]
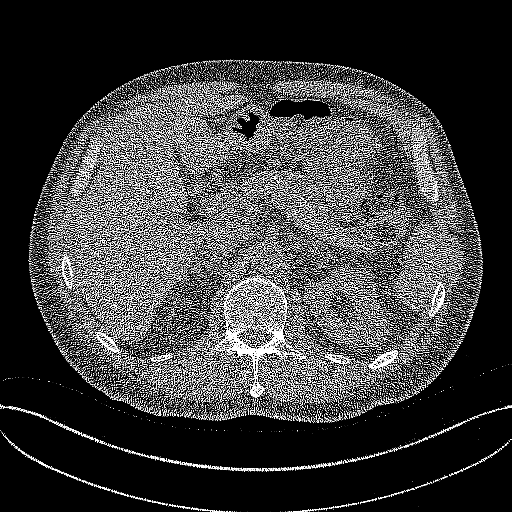
[im 18/368  lung]
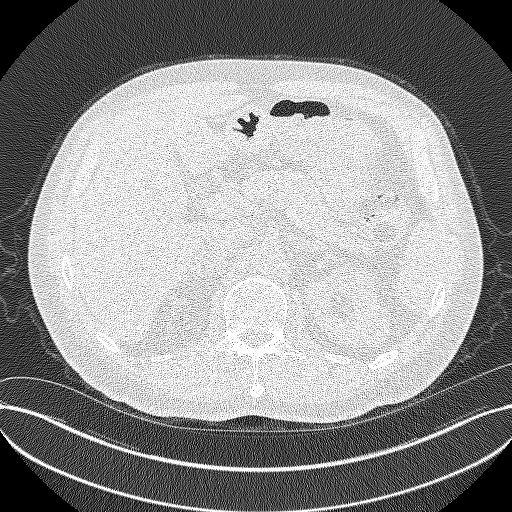
[im 53/368  lung]
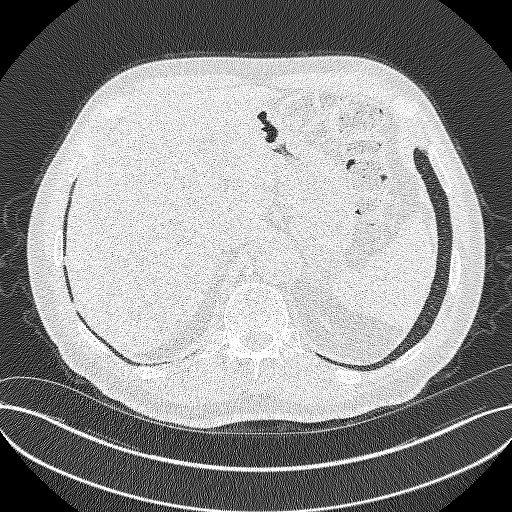
[im 88/368  lung]
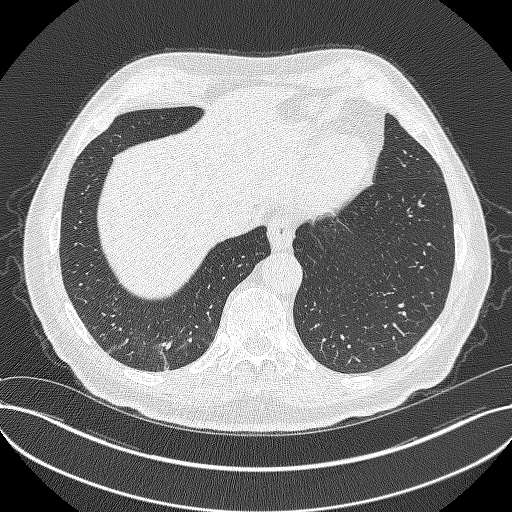
[im 123/368  lung]
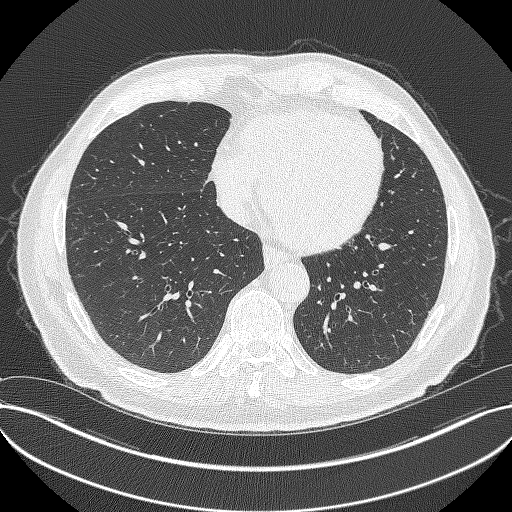
[im 158/368  mediastinal]
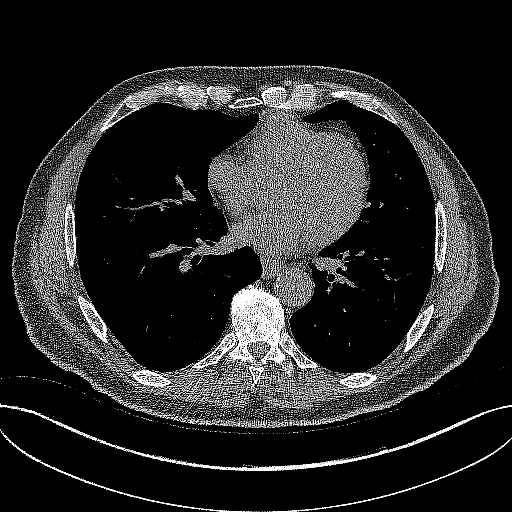
[im 158/368  lung]
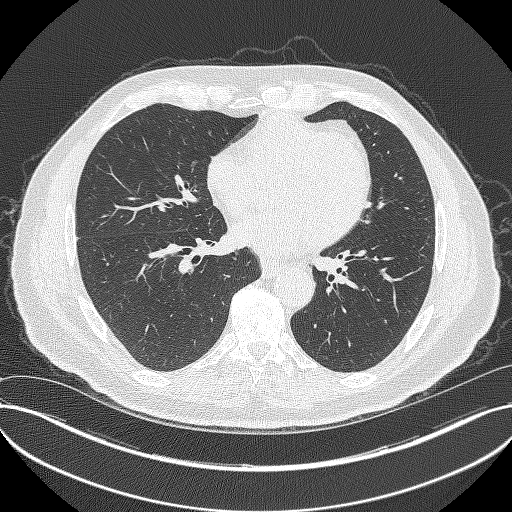
[im 193/368  lung]
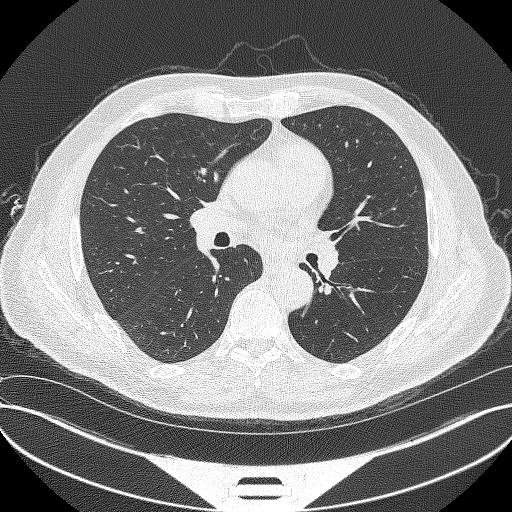
[im 210/368  lung]
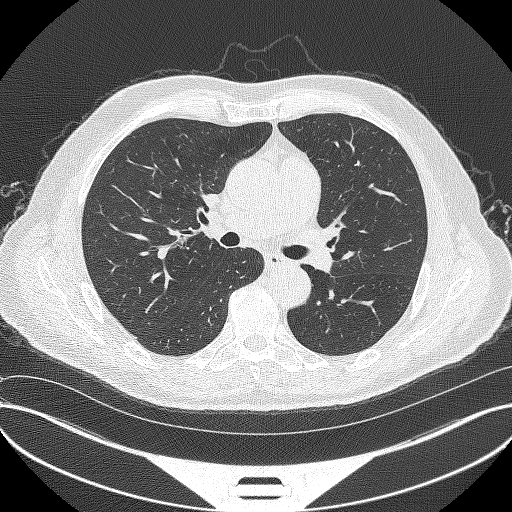
[im 245/368  lung]
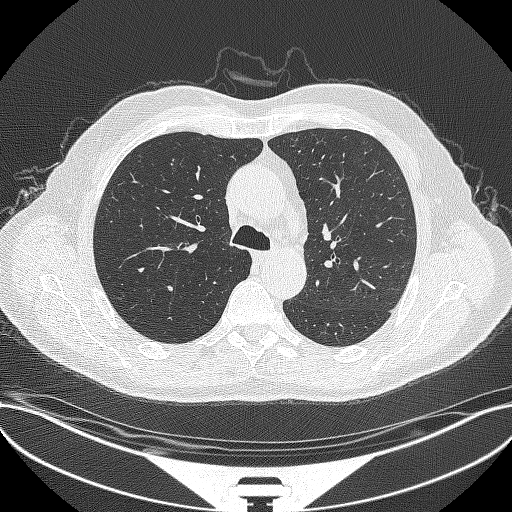
[im 280/368  mediastinal]
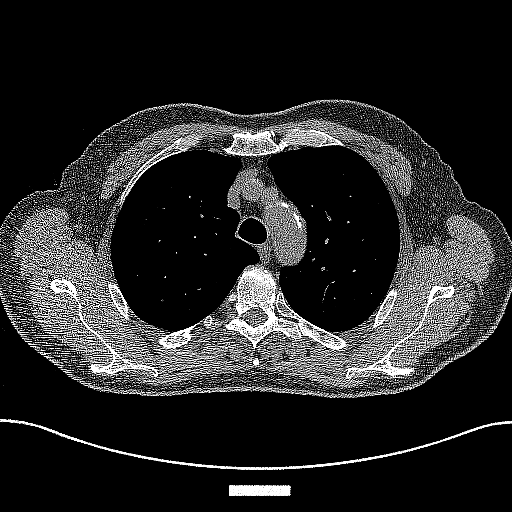
[im 280/368  lung]
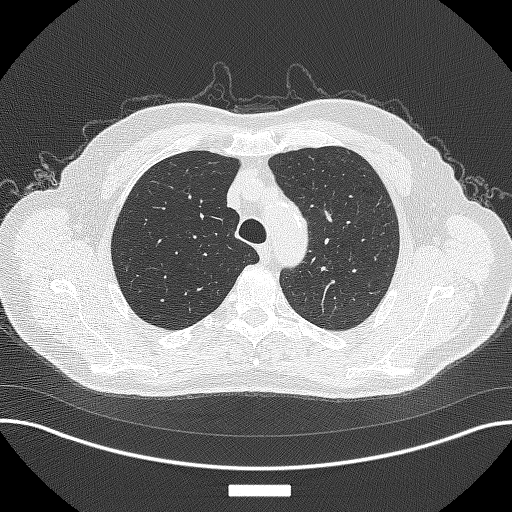
[im 315/368  lung]
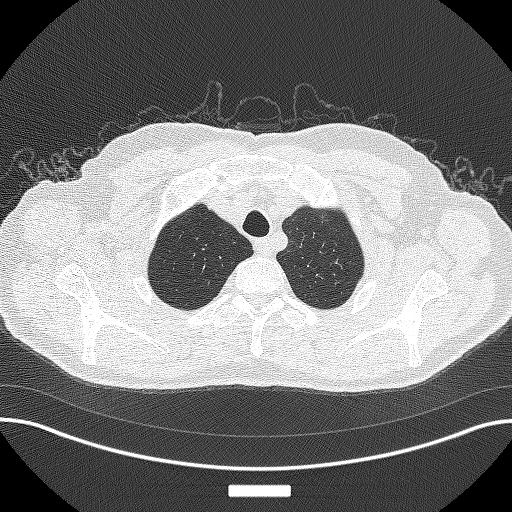
[im 350/368  lung]
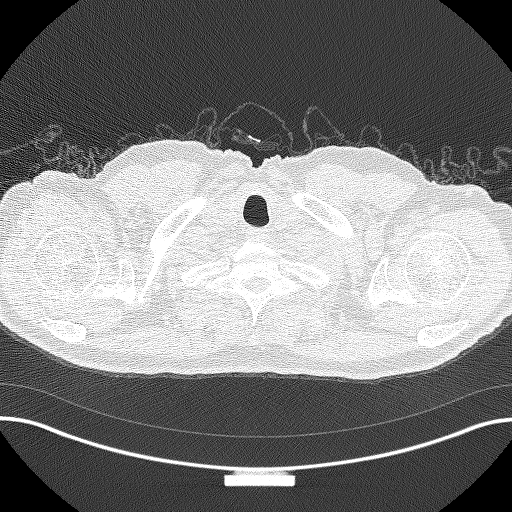

[14 of 40 positions shown; findings below may reference images not displayed]

FINDINGS: Cardiovascular: Heart size is normal. There is no significant
pericardial fluid, thickening or pericardial calcification. There is
aortic atherosclerosis, as well as atherosclerosis of the great
vessels of the mediastinum and the coronary arteries, including
calcified atherosclerotic plaque in the left anterior descending
coronary artery.

Mediastinum/Nodes: No pathologically enlarged mediastinal or hilar
lymph nodes. Please note that accurate exclusion of hilar adenopathy
is limited on noncontrast CT scans. Esophagus is unremarkable in
appearance. No axillary lymphadenopathy.

Lungs/Pleura: Multiple small pulmonary nodules are noted throughout
the lungs bilaterally, largest of which is in the periphery of the
left lower lobe (axial image 269 of series 3), with a volume derived
mean diameter 4.7 mm. No other larger more suspicious appearing
pulmonary nodules or masses are noted. No acute consolidative
airspace disease. No pleural effusions. Diffuse bronchial wall
thickening with very mild centrilobular and paraseptal emphysema.

Upper Abdomen: Aortic atherosclerosis. Right adrenal nodule
measuring 3.0 x 2.3 cm (0 HU), compatible with a benign adrenal
adenoma. Smaller 2.0 x 1.2 cm left adrenal adenoma also noted.
cm low-attenuation lesion between segments 2 and 4A of the liver,
incompletely characterized on today's non-contrast CT examination,
but similar to the prior study and statistically likely to represent
a cyst. Incompletely imaged calcified gallstone in the gallbladder.

Musculoskeletal: There are no aggressive appearing lytic or blastic
lesions noted in the visualized portions of the skeleton.
IMPRESSION: 1. Lung-RADS 2S, benign appearance or behavior. Continue annual
screening with low-dose chest CT without contrast in 12 months.
2. The "S" modifier above refers to potentially clinically
significant non lung cancer related findings. Specifically, there is
aortic atherosclerosis, in addition to left anterior descending
coronary artery disease. Please note that although the presence of
coronary artery calcium documents the presence of coronary artery
disease, the severity of this disease and any potential stenosis
cannot be assessed on this non-gated CT examination. Assessment for
potential risk factor modification, dietary therapy or pharmacologic
therapy may be warranted, if clinically indicated.
3. Mild diffuse bronchial wall thickening with very mild
centrilobular and paraseptal emphysema; imaging findings suggestive
of underlying COPD.
4. Bilateral adrenal adenomas.
5. Cholelithiasis.

Aortic Atherosclerosis ([id number].I) and Emphysema ([id number]-JFR.L).

## 2021-09-25 DIAGNOSIS — K50012 Crohn's disease of small intestine with intestinal obstruction: Secondary | ICD-10-CM | POA: Diagnosis not present

## 2021-10-16 DIAGNOSIS — K5000 Crohn's disease of small intestine without complications: Secondary | ICD-10-CM | POA: Diagnosis not present

## 2021-10-16 DIAGNOSIS — Z125 Encounter for screening for malignant neoplasm of prostate: Secondary | ICD-10-CM | POA: Diagnosis not present

## 2021-10-16 DIAGNOSIS — Z122 Encounter for screening for malignant neoplasm of respiratory organs: Secondary | ICD-10-CM | POA: Diagnosis not present

## 2021-10-16 DIAGNOSIS — I700 Atherosclerosis of aorta: Secondary | ICD-10-CM | POA: Diagnosis not present

## 2021-10-16 DIAGNOSIS — E538 Deficiency of other specified B group vitamins: Secondary | ICD-10-CM | POA: Diagnosis not present

## 2021-10-16 DIAGNOSIS — I10 Essential (primary) hypertension: Secondary | ICD-10-CM | POA: Diagnosis not present

## 2021-12-09 ENCOUNTER — Other Ambulatory Visit: Payer: Self-pay | Admitting: Gastroenterology

## 2021-12-09 DIAGNOSIS — R1084 Generalized abdominal pain: Secondary | ICD-10-CM

## 2021-12-09 DIAGNOSIS — K50012 Crohn's disease of small intestine with intestinal obstruction: Secondary | ICD-10-CM

## 2021-12-10 ENCOUNTER — Ambulatory Visit
Admission: RE | Admit: 2021-12-10 | Discharge: 2021-12-10 | Disposition: A | Discharge status: Home / Self Care | Payer: Medicare Other | Source: Ambulatory Visit | Attending: Gastroenterology | Admitting: Gastroenterology

## 2021-12-10 DIAGNOSIS — K8020 Calculus of gallbladder without cholecystitis without obstruction: Secondary | ICD-10-CM | POA: Diagnosis not present

## 2021-12-10 DIAGNOSIS — K3189 Other diseases of stomach and duodenum: Secondary | ICD-10-CM | POA: Diagnosis not present

## 2021-12-10 DIAGNOSIS — K6389 Other specified diseases of intestine: Secondary | ICD-10-CM | POA: Diagnosis not present

## 2021-12-10 DIAGNOSIS — R1084 Generalized abdominal pain: Secondary | ICD-10-CM

## 2021-12-10 DIAGNOSIS — K5939 Other megacolon: Secondary | ICD-10-CM | POA: Diagnosis not present

## 2021-12-10 DIAGNOSIS — K50012 Crohn's disease of small intestine with intestinal obstruction: Secondary | ICD-10-CM

## 2021-12-10 MED ORDER — IOPAMIDOL (ISOVUE-300) INJECTION 61%
100.0000 mL | Freq: Once | INTRAVENOUS | Status: AC | PRN
  Administered 2021-12-10: 100 mL via INTRAVENOUS

## 2022-01-26 ENCOUNTER — Other Ambulatory Visit: Payer: Self-pay

## 2022-01-26 DIAGNOSIS — F17210 Nicotine dependence, cigarettes, uncomplicated: Secondary | ICD-10-CM

## 2022-01-26 DIAGNOSIS — Z122 Encounter for screening for malignant neoplasm of respiratory organs: Secondary | ICD-10-CM

## 2022-01-26 DIAGNOSIS — Z87891 Personal history of nicotine dependence: Secondary | ICD-10-CM

## 2022-01-30 DIAGNOSIS — Z122 Encounter for screening for malignant neoplasm of respiratory organs: Secondary | ICD-10-CM | POA: Diagnosis not present

## 2022-01-30 DIAGNOSIS — I700 Atherosclerosis of aorta: Secondary | ICD-10-CM | POA: Diagnosis not present

## 2022-01-30 DIAGNOSIS — Z0000 Encounter for general adult medical examination without abnormal findings: Secondary | ICD-10-CM | POA: Diagnosis not present

## 2022-01-30 DIAGNOSIS — Z125 Encounter for screening for malignant neoplasm of prostate: Secondary | ICD-10-CM | POA: Diagnosis not present

## 2022-01-30 DIAGNOSIS — Z1331 Encounter for screening for depression: Secondary | ICD-10-CM | POA: Diagnosis not present

## 2022-01-30 DIAGNOSIS — Z1211 Encounter for screening for malignant neoplasm of colon: Secondary | ICD-10-CM | POA: Diagnosis not present

## 2022-01-30 DIAGNOSIS — Z720 Tobacco use: Secondary | ICD-10-CM | POA: Diagnosis not present

## 2022-02-05 DIAGNOSIS — Z23 Encounter for immunization: Secondary | ICD-10-CM | POA: Diagnosis not present

## 2022-02-06 DIAGNOSIS — K50918 Crohn's disease, unspecified, with other complication: Secondary | ICD-10-CM | POA: Diagnosis not present

## 2022-02-10 ENCOUNTER — Ambulatory Visit
Admission: RE | Admit: 2022-02-10 | Discharge: 2022-02-10 | Disposition: A | Discharge status: Home / Self Care | Payer: Medicare Other | Source: Ambulatory Visit | Attending: Internal Medicine | Admitting: Internal Medicine

## 2022-02-10 DIAGNOSIS — F17210 Nicotine dependence, cigarettes, uncomplicated: Secondary | ICD-10-CM | POA: Diagnosis not present

## 2022-02-10 DIAGNOSIS — Z87891 Personal history of nicotine dependence: Secondary | ICD-10-CM

## 2022-02-10 DIAGNOSIS — Z122 Encounter for screening for malignant neoplasm of respiratory organs: Secondary | ICD-10-CM

## 2022-02-11 ENCOUNTER — Other Ambulatory Visit: Payer: Self-pay

## 2022-02-11 DIAGNOSIS — F17210 Nicotine dependence, cigarettes, uncomplicated: Secondary | ICD-10-CM

## 2022-02-11 DIAGNOSIS — Z87891 Personal history of nicotine dependence: Secondary | ICD-10-CM

## 2022-02-11 DIAGNOSIS — Z122 Encounter for screening for malignant neoplasm of respiratory organs: Secondary | ICD-10-CM

## 2022-02-25 DIAGNOSIS — K648 Other hemorrhoids: Secondary | ICD-10-CM | POA: Diagnosis not present

## 2022-02-25 DIAGNOSIS — K5289 Other specified noninfective gastroenteritis and colitis: Secondary | ICD-10-CM | POA: Diagnosis not present

## 2022-02-25 DIAGNOSIS — K50812 Crohn's disease of both small and large intestine with intestinal obstruction: Secondary | ICD-10-CM | POA: Diagnosis not present

## 2022-02-25 DIAGNOSIS — K633 Ulcer of intestine: Secondary | ICD-10-CM | POA: Diagnosis not present

## 2022-02-27 DIAGNOSIS — K5289 Other specified noninfective gastroenteritis and colitis: Secondary | ICD-10-CM | POA: Diagnosis not present

## 2022-03-23 ENCOUNTER — Ambulatory Visit: Payer: Self-pay | Admitting: Surgery

## 2022-03-23 ENCOUNTER — Encounter: Payer: Self-pay | Admitting: Surgery

## 2022-03-23 DIAGNOSIS — R739 Hyperglycemia, unspecified: Secondary | ICD-10-CM

## 2022-03-23 DIAGNOSIS — K50012 Crohn's disease of small intestine with intestinal obstruction: Secondary | ICD-10-CM | POA: Diagnosis not present

## 2022-03-23 DIAGNOSIS — K5000 Crohn's disease of small intestine without complications: Secondary | ICD-10-CM | POA: Insufficient documentation

## 2022-03-23 DIAGNOSIS — K913 Postprocedural intestinal obstruction, unspecified as to partial versus complete: Secondary | ICD-10-CM | POA: Diagnosis not present

## 2022-03-23 HISTORY — DX: Crohn's disease of small intestine without complications: K50.00

## 2022-04-14 NOTE — Patient Instructions (Signed)
SURGICAL WAITING ROOM VISITATION Patients having surgery or a procedure may have no more than 2 support people in the waiting area - these visitors may rotate in the visitor waiting room.   Children under the age of 50 must have an adult with them who is not the patient. If the patient needs to stay at the hospital during part of their recovery, the visitor guidelines for inpatient rooms apply.  PRE-OP VISITATION  Pre-op nurse will coordinate an appropriate time for 1 support person to accompany the patient in pre-op.  This support person may not rotate.  This visitor will be contacted when the time is appropriate for the visitor to come back in the pre-op area.  Please refer to the [facility] website for the visitor guidelines for Inpatients (after your surgery is over and you are in a regular room).  You are not required to quarantine at this time prior to your surgery. However, you must do this: Hand Hygiene often Do NOT share personal items Notify your provider if you are in close contact with someone who has COVID or you develop fever 100.4 or greater, new onset of sneezing, cough, sore throat, shortness of breath or body aches.   If you received a COVID test during your pre-op visit  it is requested that you wear a mask when out in public, stay away from anyone that may not be feeling well and notify your surgeon if you develop symptoms. If you test positive for Covid or have been in contact with anyone that has tested positive in the last 10 days please notify you surgeon.       Your procedure is scheduled on:  Wednesday  April 29, 2022  Report to [facility] Main Entrance.  Report to admitting at: 10:15    AM  +++++Call this number if you have any questions or problems the morning of surgery [phone]   Do not eat food :After Midnight the night prior to your surgery/procedure.  After Midnight you may have the following liquids until  09:30 AM DAY OF  SURGERY  Clear Liquid Diet Water Black Coffee (sugar ok, NO MILK/CREAM OR CREAMERS)  Tea (sugar ok, NO MILK/CREAM OR CREAMERS) regular and decaf                             Plain Jell-O  with no fruit (NO RED)                                           Fruit ices (not with fruit pulp, NO RED)                                     Popsicles (NO RED)                                                                  Juice: apple, WHITE grape, WHITE cranberry Sports drinks like Gatorade or Powerade (NO RED)           DRINK 2 BOTTLES  OF ENSURE PRE-SURGERY CLEAR the evening before your surgery.        The day of surgery:  Drink ONE (1) Pre-Surgery Clear Ensure or G2 at   09:30     AM the morning of surgery. Drink in one sitting. Do not sip.  This drink was given to you during your hospital pre-op appointment visit. Nothing else to drink after completing the Pre-Surgery Clear Ensure : No candy, chewing gum or throat lozenges.    FOLLOW BOWEL PREP AND ANY ADDITIONAL PRE OP INSTRUCTIONS YOU RECEIVED FROM YOUR SURGEON'S OFFICE!!!  Dulcolax 20 mg - Take four (4) tablets with water at 07:00 am the day prior to surgery.  Miralax 255 g - Mix with 64 oz Gatorade/Powerade.  Starting at 10:00 am ,Drink this gradually over the next few hours (8 oz glass every 15-30 minutes) until gone the day prior to surgery You should finish in 4 hours-6 hours.    Neomycin 1000 mg - At 2 pm, 3 pm and 10 pm after Miralax  bowel prep the day prior to surgery.  Metronidazole 1000 mg - At 2 pm, 3 pm and 10 pm after Miralax bowel prep the day prior to surgery.   Drink plenty of clear liquids all evening to avoid getting dehydrated.       Oral Hygiene is also important to reduce your risk of infection.        Remember - BRUSH YOUR TEETH THE MORNING OF SURGERY WITH YOUR REGULAR TOOTHPASTE  Do NOT smoke after Midnight the night before surgery.  Take ONLY these medicines the morning of surgery with A SIP OF WATER:    none                    You may not have any metal on your body including jewelry, and body piercing  Do not wear  lotions, powders,  cologne, or deodorant   Men may shave face and neck.  Contacts, Hearing Aids, dentures or bridgework may not be worn into surgery.   You may bring a small overnight bag with you on the day of surgery, only pack items that are not valuable .[facility] IS NOT RESPONSIBLE   FOR VALUABLES THAT ARE LOST OR STOLEN.   DO NOT BRING YOUR HOME MEDICATIONS TO THE HOSPITAL. PHARMACY WILL DISPENSE MEDICATIONS LISTED ON YOUR MEDICATION LIST TO YOU DURING YOUR ADMISSION IN THE HOSPITAL!    Special Instructions: Bring a copy of your healthcare power of attorney and living will documents the day of surgery, if you wish to have them scanned into your [facility] Records- EPIC  Please read over the following fact sheets you were given: IF YOU HAVE QUESTIONS ABOUT YOUR PRE-OP INSTRUCTIONS, PLEASE CALL [phone]  (KAY)   [place] - Preparing for Surgery Before surgery, you can play an important role.  Because skin is not sterile, your skin needs to be as free of germs as possible.  You can reduce the number of germs on your skin by washing with CHG (chlorahexidine gluconate) soap before surgery.  CHG is an antiseptic cleaner which kills germs and bonds with the skin to continue killing germs even after washing. Please DO NOT use if you have an allergy to CHG or antibacterial soaps.  If your skin becomes reddened/irritated stop using the CHG and inform your nurse when you arrive at Short Stay. Do not shave (including legs and underarms) for at least 48 hours prior to the first CHG shower.  You may shave your face/neck.  Please follow these instructions carefully:  1.  Shower with CHG Soap the night before surgery and the  morning of surgery.  2.  If you choose to wash your hair, wash your hair first as usual with your normal  shampoo.  3.  After you shampoo,  rinse your hair and body thoroughly to remove the shampoo.                             4.  Use CHG as you would any other liquid soap.  You can apply chg directly to the skin and wash.  Gently with a scrungie or clean washcloth.  5.  Apply the CHG Soap to your body ONLY FROM THE NECK DOWN.   Do not use on face/ open                           Wound or open sores. Avoid contact with eyes, ears mouth and genitals (private parts).                       Wash face,  Genitals (private parts) with your normal soap.             6.  Wash thoroughly, paying special attention to the area where your  surgery  will be performed.  7.  Thoroughly rinse your body with warm water from the neck down.  8.  DO NOT shower/wash with your normal soap after using and rinsing off the CHG Soap.            9.  Pat yourself dry with a clean towel.            10.  Wear clean pajamas.            11.  Place clean sheets on your bed the night of your first shower and do not  sleep with pets.  ON THE DAY OF SURGERY : Do not apply any lotions/deodorants the morning of surgery.  Please wear clean clothes to the hospital/surgery center.    FAILURE TO FOLLOW THESE INSTRUCTIONS MAY RESULT IN THE CANCELLATION OF YOUR SURGERY  PATIENT SIGNATURE_________________________________  NURSE SIGNATURE__________________________________  ________________________________________________________________________       Rogelia Mire    An incentive spirometer is a tool that can help keep your lungs clear and active. This tool measures how well you are filling your lungs with each breath. Taking long deep breaths may help reverse or decrease the chance of developing breathing (pulmonary) problems (especially infection) following: A long period of time when you are unable to move or be active. BEFORE THE PROCEDURE  If the spirometer includes an indicator to show your best effort, your nurse or respiratory therapist will set it to  a desired goal. If possible, sit up straight or lean slightly forward. Try not to slouch. Hold the incentive spirometer in an upright position. INSTRUCTIONS FOR USE  Sit on the edge of your bed if possible, or sit up as far as you can in bed or on a chair. Hold the incentive spirometer in an upright position. Breathe out normally. Place the mouthpiece in your mouth and seal your lips tightly around it. Breathe in slowly and as deeply as possible, raising the piston or the ball toward the top of the column. Hold your breath for 3-5 seconds or for as long  as possible. Allow the piston or ball to fall to the bottom of the column. Remove the mouthpiece from your mouth and breathe out normally. Rest for a few seconds and repeat Steps 1 through 7 at least 10 times every 1-2 hours when you are awake. Take your time and take a few normal breaths between deep breaths. The spirometer may include an indicator to show your best effort. Use the indicator as a goal to work toward during each repetition. After each set of 10 deep breaths, practice coughing to be sure your lungs are clear. If you have an incision (the cut made at the time of surgery), support your incision when coughing by placing a pillow or rolled up towels firmly against it. Once you are able to get out of bed, walk around indoors and cough well. You may stop using the incentive spirometer when instructed by your caregiver.  RISKS AND COMPLICATIONS Take your time so you do not get dizzy or light-headed. If you are in pain, you may need to take or ask for pain medication before doing incentive spirometry. It is harder to take a deep breath if you are having pain. AFTER USE Rest and breathe slowly and easily. It can be helpful to keep track of a log of your progress. Your caregiver can provide you with a simple table to help with this. If you are using the spirometer at home, follow these instructions: SEEK MEDICAL CARE IF:  You are having  difficultly using the spirometer. You have trouble using the spirometer as often as instructed. Your pain medication is not giving enough relief while using the spirometer. You develop fever of 100.5 F (38.1 C) or higher.                                                                                                    SEEK IMMEDIATE MEDICAL CARE IF:  You cough up bloody sputum that had not been present before. You develop fever of 102 F (38.9 C) or greater. You develop worsening pain at or near the incision site. MAKE SURE YOU:  Understand these instructions. Will watch your condition. Will get help right away if you are not doing well or get worse. Document Released: 11/09/2006 Document Revised: 09/21/2011 Document Reviewed: 01/10/2007 [facility] Patient Information 2014 [place], [place].

## 2022-04-14 NOTE — Progress Notes (Signed)
COVID Vaccine received:  []  No [x]  Yes x4 Date of any COVID positive Test in last 90 days: none  PCP - Okey Dupre, MD Cardiologist - none GI- Dr. Wilford Corner  [address] [place]   [place], [place] [id number]   [phone] (Work)   [phone] (Fax)    Chest x-ray - n/a EKG -  n/a   Will get at PST Stress Test - n/a ECHO - n/a Cardiac Cath - n/a CT [facility] Lung screening- 02-10-2022 Epic  Pacemaker/ICD device     [x]  N/A Spinal Cord Stimulator:[x]  No []  Yes      (Remind patient to bring remote DOS) Other Implants:   Bowel Prep - Patient to call Dr. Clyda Greener office to clarify bowel prep instructions (patient states that he only received info regarding POSTOP instructions.)  History of Sleep Apnea? [x]  No []  Yes   Sleep Study Date:   CPAP used?- [x]  No []  Yes  (Instruct to bring their mask & Tubing)  Does the patient monitor blood sugar? []  No []  Yes  [x]  N/A   Blood Thinner Instructions: none Aspirin Instructions: Last Dose:  ERAS Protocol Ordered: []  No  [x]  Yes PRE-SURGERY [x]  ENSURE X 3 (drink 2 bottles the night before surgery and 1 bottle the morning of surgery)   Comments: Patient is to see Ostomy nurse for markings. (Melody came and marked the patient's abdomen and discussed the purpose) Patient understanding.   Activity level: Patient can not climb a flight of stairs without difficulty;  [x]  No CP  [x]  No SOB,  but would have leg weakness__   Anesthesia review: smoker, CAD found on CT chest/ lung screen X2- no cardiology workup.  Patient denies shortness of breath, fever, cough and chest pain at PAT appointment.  Patient verbalized understanding and agreement to the Pre-Surgical Instructions that were given to them at this PAT appointment. Patient was also educated of the need to review these PAT instructions again prior to his/her surgery.I reviewed the appropriate phone numbers to call if they have any and questions or concerns.

## 2022-04-16 ENCOUNTER — Encounter (HOSPITAL_COMMUNITY): Payer: Self-pay

## 2022-04-16 ENCOUNTER — Encounter (HOSPITAL_COMMUNITY)
Admission: RE | Admit: 2022-04-16 | Discharge: 2022-04-16 | Disposition: A | Discharge status: Home / Self Care | Payer: Medicare Other | Source: Ambulatory Visit | Attending: Surgery | Admitting: Surgery

## 2022-04-16 ENCOUNTER — Other Ambulatory Visit: Payer: Self-pay

## 2022-04-16 VITALS — BP 108/62 | HR 90 | Temp 98.8°F | Resp 22 | Ht 76.0 in | Wt 189.0 lb

## 2022-04-16 DIAGNOSIS — Z01818 Encounter for other preprocedural examination: Secondary | ICD-10-CM | POA: Diagnosis not present

## 2022-04-16 DIAGNOSIS — R739 Hyperglycemia, unspecified: Secondary | ICD-10-CM | POA: Insufficient documentation

## 2022-04-16 DIAGNOSIS — I2510 Atherosclerotic heart disease of native coronary artery without angina pectoris: Secondary | ICD-10-CM | POA: Insufficient documentation

## 2022-04-16 LAB — BASIC METABOLIC PANEL
Anion gap: 7 (ref 5–15)
BUN: 25 mg/dL — ABNORMAL HIGH (ref 8–23)
CO2: 23 mmol/L (ref 22–32)
Calcium: 8.5 mg/dL — ABNORMAL LOW (ref 8.9–10.3)
Chloride: 108 mmol/L (ref 98–111)
Creatinine, Ser: 1.12 mg/dL (ref 0.61–1.24)
GFR, Estimated: >60 mL/min (ref >60)
Glucose, Bld: 93 mg/dL (ref 70–99)
Potassium: 3.7 mmol/L (ref 3.5–5.1)
Sodium: 138 mmol/L (ref 135–145)

## 2022-04-16 LAB — CBC
HCT: 45.1 % (ref 39.0–52.0)
Hemoglobin: 14.9 g/dL (ref 13.0–17.0)
MCH: 30.4 pg (ref 26.0–34.0)
MCHC: 33 g/dL (ref 30.0–36.0)
MCV: 92 fL (ref 80.0–100.0)
Platelets: 364 10*3/uL (ref 150–400)
RBC: 4.9 MIL/uL (ref 4.22–5.81)
RDW: 14.7 % (ref 11.5–15.5)
WBC: 10.3 10*3/uL (ref 4.0–10.5)
nRBC: 0 % (ref 0.0–0.2)

## 2022-04-16 LAB — HEMOGLOBIN A1C
Hgb A1c MFr Bld: 6 % — ABNORMAL HIGH (ref 4.8–5.6)
Mean Plasma Glucose: 125.5 mg/dL

## 2022-04-16 NOTE — Consult Note (Signed)
[facility] Nurse requested for preoperative stoma site marking  Discussed surgical procedure and stoma creation with patient.  Explained role of the [facility] nurse team.  Provided the patient with educational booklet and provided samples of pouching options.   Examined patient sitting and standing in order to place the marking in the patient's visual field, away from any creases or abdominal contour issues and within the rectus muscle.    Marked for colostomy in the LLQ  __2__ cm to the left of the umbilicus and __7__MB below the umbilicus.  Marked for ileostomy in the RLQ  _3___cm to the right of the umbilicus and  __3__ cm below the umbilicus.  Patient's abdomen cleansed with CHG wipes at site markings, allowed to air dry prior to marking.Covered mark with thin film transparent dressing to preserve mark until date of surgery.   [place] Nurse team will follow up with patient after surgery for continue ostomy care and teaching.  [place] MSN, [place], [place], [place]

## 2022-04-24 ENCOUNTER — Ambulatory Visit: Payer: Self-pay | Admitting: Surgery

## 2022-04-26 ENCOUNTER — Emergency Department (HOSPITAL_COMMUNITY): Payer: Medicare Other

## 2022-04-26 ENCOUNTER — Other Ambulatory Visit: Payer: Self-pay

## 2022-04-26 ENCOUNTER — Emergency Department (HOSPITAL_COMMUNITY)
Admission: EM | Admit: 2022-04-26 | Discharge: 2022-04-26 | Disposition: A | Discharge status: Home / Self Care | Payer: Medicare Other | Source: Home / Self Care | Attending: Emergency Medicine | Admitting: Emergency Medicine

## 2022-04-26 ENCOUNTER — Encounter (HOSPITAL_COMMUNITY): Payer: Self-pay | Admitting: Emergency Medicine

## 2022-04-26 DIAGNOSIS — F17210 Nicotine dependence, cigarettes, uncomplicated: Secondary | ICD-10-CM | POA: Diagnosis not present

## 2022-04-26 DIAGNOSIS — K5090 Crohn's disease, unspecified, without complications: Secondary | ICD-10-CM | POA: Diagnosis not present

## 2022-04-26 DIAGNOSIS — K660 Peritoneal adhesions (postprocedural) (postinfection): Secondary | ICD-10-CM | POA: Diagnosis not present

## 2022-04-26 DIAGNOSIS — Z823 Family history of stroke: Secondary | ICD-10-CM | POA: Diagnosis not present

## 2022-04-26 DIAGNOSIS — Z800 Family history of malignant neoplasm of digestive organs: Secondary | ICD-10-CM | POA: Diagnosis not present

## 2022-04-26 DIAGNOSIS — M4802 Spinal stenosis, cervical region: Secondary | ICD-10-CM | POA: Diagnosis not present

## 2022-04-26 DIAGNOSIS — D849 Immunodeficiency, unspecified: Secondary | ICD-10-CM | POA: Diagnosis not present

## 2022-04-26 DIAGNOSIS — S0012XA Contusion of left eyelid and periocular area, initial encounter: Secondary | ICD-10-CM | POA: Insufficient documentation

## 2022-04-26 DIAGNOSIS — Z1152 Encounter for screening for COVID-19: Secondary | ICD-10-CM | POA: Insufficient documentation

## 2022-04-26 DIAGNOSIS — Z886 Allergy status to analgesic agent status: Secondary | ICD-10-CM | POA: Diagnosis not present

## 2022-04-26 DIAGNOSIS — Z8249 Family history of ischemic heart disease and other diseases of the circulatory system: Secondary | ICD-10-CM | POA: Diagnosis not present

## 2022-04-26 DIAGNOSIS — K50913 Crohn's disease, unspecified, with fistula: Secondary | ICD-10-CM | POA: Diagnosis not present

## 2022-04-26 DIAGNOSIS — X58XXXA Exposure to other specified factors, initial encounter: Secondary | ICD-10-CM | POA: Insufficient documentation

## 2022-04-26 DIAGNOSIS — R55 Syncope and collapse: Secondary | ICD-10-CM | POA: Insufficient documentation

## 2022-04-26 DIAGNOSIS — T797XXA Traumatic subcutaneous emphysema, initial encounter: Secondary | ICD-10-CM | POA: Diagnosis not present

## 2022-04-26 DIAGNOSIS — E785 Hyperlipidemia, unspecified: Secondary | ICD-10-CM | POA: Diagnosis not present

## 2022-04-26 DIAGNOSIS — M6281 Muscle weakness (generalized): Secondary | ICD-10-CM | POA: Diagnosis not present

## 2022-04-26 DIAGNOSIS — Z20822 Contact with and (suspected) exposure to covid-19: Secondary | ICD-10-CM | POA: Diagnosis not present

## 2022-04-26 DIAGNOSIS — K50012 Crohn's disease of small intestine with intestinal obstruction: Secondary | ICD-10-CM | POA: Diagnosis not present

## 2022-04-26 DIAGNOSIS — K630 Abscess of intestine: Secondary | ICD-10-CM | POA: Diagnosis not present

## 2022-04-26 DIAGNOSIS — Z6823 Body mass index (BMI) 23.0-23.9, adult: Secondary | ICD-10-CM | POA: Diagnosis not present

## 2022-04-26 DIAGNOSIS — K9189 Other postprocedural complications and disorders of digestive system: Secondary | ICD-10-CM | POA: Diagnosis not present

## 2022-04-26 DIAGNOSIS — S80811A Abrasion, right lower leg, initial encounter: Secondary | ICD-10-CM | POA: Insufficient documentation

## 2022-04-26 DIAGNOSIS — I213 ST elevation (STEMI) myocardial infarction of unspecified site: Secondary | ICD-10-CM | POA: Diagnosis not present

## 2022-04-26 DIAGNOSIS — R000 Tachycardia, unspecified: Secondary | ICD-10-CM | POA: Diagnosis not present

## 2022-04-26 DIAGNOSIS — D638 Anemia in other chronic diseases classified elsewhere: Secondary | ICD-10-CM | POA: Diagnosis not present

## 2022-04-26 DIAGNOSIS — I739 Peripheral vascular disease, unspecified: Secondary | ICD-10-CM | POA: Diagnosis not present

## 2022-04-26 DIAGNOSIS — K529 Noninfective gastroenteritis and colitis, unspecified: Secondary | ICD-10-CM | POA: Diagnosis not present

## 2022-04-26 DIAGNOSIS — E876 Hypokalemia: Secondary | ICD-10-CM | POA: Diagnosis not present

## 2022-04-26 DIAGNOSIS — E440 Moderate protein-calorie malnutrition: Secondary | ICD-10-CM | POA: Diagnosis not present

## 2022-04-26 DIAGNOSIS — D509 Iron deficiency anemia, unspecified: Secondary | ICD-10-CM | POA: Diagnosis not present

## 2022-04-26 DIAGNOSIS — Z4682 Encounter for fitting and adjustment of non-vascular catheter: Secondary | ICD-10-CM | POA: Diagnosis not present

## 2022-04-26 DIAGNOSIS — K565 Intestinal adhesions [bands], unspecified as to partial versus complete obstruction: Secondary | ICD-10-CM | POA: Diagnosis not present

## 2022-04-26 DIAGNOSIS — K8020 Calculus of gallbladder without cholecystitis without obstruction: Secondary | ICD-10-CM | POA: Diagnosis present

## 2022-04-26 DIAGNOSIS — R918 Other nonspecific abnormal finding of lung field: Secondary | ICD-10-CM | POA: Diagnosis present

## 2022-04-26 DIAGNOSIS — Z9889 Other specified postprocedural states: Secondary | ICD-10-CM | POA: Diagnosis not present

## 2022-04-26 DIAGNOSIS — K219 Gastro-esophageal reflux disease without esophagitis: Secondary | ICD-10-CM | POA: Diagnosis not present

## 2022-04-26 DIAGNOSIS — I10 Essential (primary) hypertension: Secondary | ICD-10-CM | POA: Diagnosis not present

## 2022-04-26 DIAGNOSIS — K50014 Crohn's disease of small intestine with abscess: Secondary | ICD-10-CM | POA: Diagnosis not present

## 2022-04-26 DIAGNOSIS — R42 Dizziness and giddiness: Secondary | ICD-10-CM | POA: Diagnosis not present

## 2022-04-26 DIAGNOSIS — Z833 Family history of diabetes mellitus: Secondary | ICD-10-CM | POA: Diagnosis not present

## 2022-04-26 DIAGNOSIS — Z880 Allergy status to penicillin: Secondary | ICD-10-CM | POA: Diagnosis not present

## 2022-04-26 DIAGNOSIS — Z803 Family history of malignant neoplasm of breast: Secondary | ICD-10-CM | POA: Diagnosis not present

## 2022-04-26 LAB — COMPREHENSIVE METABOLIC PANEL
ALT: 38 U/L (ref 0–44)
AST: 24 U/L (ref 15–41)
Albumin: 1.9 g/dL — ABNORMAL LOW (ref 3.5–5.0)
Alkaline Phosphatase: 74 U/L (ref 38–126)
Anion gap: 4 — ABNORMAL LOW (ref 5–15)
BUN: 16 mg/dL (ref 8–23)
CO2: 23 mmol/L (ref 22–32)
Calcium: 7.8 mg/dL — ABNORMAL LOW (ref 8.9–10.3)
Chloride: 113 mmol/L — ABNORMAL HIGH (ref 98–111)
Creatinine, Ser: 1.02 mg/dL (ref 0.61–1.24)
GFR, Estimated: >60 mL/min (ref >60)
Glucose, Bld: 91 mg/dL (ref 70–99)
Potassium: 3.2 mmol/L — ABNORMAL LOW (ref 3.5–5.1)
Sodium: 140 mmol/L (ref 135–145)
Total Bilirubin: 0.5 mg/dL (ref 0.3–1.2)
Total Protein: 4.7 g/dL — ABNORMAL LOW (ref 6.5–8.1)

## 2022-04-26 LAB — CBC
HCT: 41.3 % (ref 39.0–52.0)
Hemoglobin: 13.4 g/dL (ref 13.0–17.0)
MCH: 30.4 pg (ref 26.0–34.0)
MCHC: 32.4 g/dL (ref 30.0–36.0)
MCV: 93.7 fL (ref 80.0–100.0)
Platelets: 365 10*3/uL (ref 150–400)
RBC: 4.41 MIL/uL (ref 4.22–5.81)
RDW: 15.4 % (ref 11.5–15.5)
WBC: 13.7 10*3/uL — ABNORMAL HIGH (ref 4.0–10.5)
nRBC: 0 % (ref 0.0–0.2)

## 2022-04-26 LAB — TROPONIN I (HIGH SENSITIVITY)
Troponin I (High Sensitivity): 18 ng/L — ABNORMAL HIGH (ref <18)
Troponin I (High Sensitivity): 21 ng/L — ABNORMAL HIGH (ref <18)

## 2022-04-26 LAB — LACTIC ACID, PLASMA
Lactic Acid, Venous: 1.1 mmol/L (ref 0.5–1.9)
Lactic Acid, Venous: 1.1 mmol/L (ref 0.5–1.9)

## 2022-04-26 LAB — POC OCCULT BLOOD, ED: Fecal Occult Bld: NEGATIVE

## 2022-04-26 LAB — SARS CORONAVIRUS 2 BY RT PCR: SARS Coronavirus 2 by RT PCR: NEGATIVE

## 2022-04-26 MED ORDER — LACTATED RINGERS IV BOLUS
1000.0000 mL | Freq: Once | INTRAVENOUS | Status: AC
  Administered 2022-04-26: 1000 mL via INTRAVENOUS

## 2022-04-26 NOTE — ED Provider Notes (Signed)
[facility]   CSN: [id number] Arrival date & time: 04/26/22  0841     History  Chief Complaint  Patient presents with   Loss of Consciousness    Calvin Brooks is a 66 y.o. male.  HPI 66 year old male presents today complaining of syncope.  Patient has history of Crohn's.  He has been having ongoing issues and is scheduled for a outpatient colostomy.  Secondary to this, he has been on a liquid diet for the past 2 weeks.  Reports that he had been sitting currently calorie restricting to 3 to 500/day.  He has increased his p.o. intake over the past several days.  He reports a 35 pound weight loss.  He became lightheaded and [facility]  He struck his head and left side of his chest.  He is a smoker and he is noted to have coughing on exam but denies ongoing coughing, fever, chills     Home Medications Prior to Admission medications   Medication Sig Start Date End Date Taking? Authorizing Provider  acetaminophen (TYLENOL) 500 MG tablet Take 1,000 mg by mouth every 6 (six) hours as needed for moderate pain.    [provider]  Calcium Carbonate-Vitamin D (CALCIUM-D PO) Take 1 tablet by mouth daily at 6 PM.    [provider]  Cholecalciferol (VITAMIN D3) 50 MCG (2000 UT) TABS Take 2,000 Units by mouth daily with lunch.    [provider]  cholestyramine (QUESTRAN) 4 g packet Take 4 g by mouth daily.    [provider]  Cyanocobalamin (B-12) 5000 MCG SUBL Place 5,000 mcg under the tongue in the morning.    [provider]  ferrous sulfate 325 (65 FE) MG tablet Take 325 mg by mouth in the morning.    [provider]  Multiple Vitamin (MULTIVITAMIN WITH MINERALS) TABS tablet Take 1 tablet by mouth in the morning.    [provider]  PENTASA 500 MG CR capsule Take 1,000 mg by mouth 4 (four) times daily. 04/02/22   [provider]   PREVALITE 4 g packet Take 4 g by mouth in the morning. 04/01/22   [provider]  Probiotic Product (PROBIOTIC PO) Take 1 capsule by mouth every Monday, Tuesday, Wednesday, Thursday, and Friday. Five times a week    [provider]  rosuvastatin (CRESTOR) 10 MG tablet Take 10 mg by mouth at bedtime. 01/14/22   [provider]      Allergies    Nsaids and Penicillins    Review of Systems   Review of Systems  Physical Exam Updated Vital Signs BP 114/61   Pulse 73   Temp 98.5 F (36.9 C) (Rectal)   Resp 14   Ht 1.93 m (6\' 4" )   Wt 88 kg   SpO2 98%   BMI 23.61 kg/m  Physical Exam Vitals and nursing note reviewed.  Constitutional:      General: He is not in acute distress.    Appearance: Normal appearance. He is ill-appearing.  HENT:     Head: Normocephalic.     Right Ear: External ear normal.     Left Ear: External ear normal.     Nose: Nose normal.     Mouth/Throat:     Mouth: Mucous membranes are dry.     Pharynx: Oropharynx is clear.  Eyes:     Extraocular Movements: Extraocular movements intact.     Pupils:  Pupils are equal, round, and reactive to light.  Cardiovascular:     Rate and Rhythm: Normal rate and regular rhythm.     Pulses: Normal pulses.  Pulmonary:     Effort: Pulmonary effort is normal.     Breath sounds: Normal breath sounds.  Abdominal:     General: Abdomen is flat. Bowel sounds are normal.     Palpations: Abdomen is soft.  Musculoskeletal:        General: Normal range of motion.     Cervical back: Normal range of motion.  Skin:    General: Skin is warm and dry.     Capillary Refill: Capillary refill takes less than 2 seconds.     Coloration: Skin is pale.  Neurological:     General: No focal deficit present.     Mental Status: He is alert.  Psychiatric:        Mood and Affect: Mood normal.     ED Results / Procedures / Treatments   Labs (all labs ordered are listed, but only abnormal results are  displayed) Labs Reviewed  CBC - Abnormal; Notable for the following components:      Result Value   WBC 13.7 (*)    All other components within normal limits  COMPREHENSIVE METABOLIC PANEL - Abnormal; Notable for the following components:   Potassium 3.2 (*)    Chloride 113 (*)    Calcium 7.8 (*)    Total Protein 4.7 (*)    Albumin 1.9 (*)    Anion gap 4 (*)    All other components within normal limits  TROPONIN I (HIGH SENSITIVITY) - Abnormal; Notable for the following components:   Troponin I (High Sensitivity) 18 (*)    All other components within normal limits  TROPONIN I (HIGH SENSITIVITY) - Abnormal; Notable for the following components:   Troponin I (High Sensitivity) 21 (*)    All other components within normal limits  SARS CORONAVIRUS 2 BY RT PCR  LACTIC ACID, PLASMA  LACTIC ACID, PLASMA  URINALYSIS, ROUTINE W REFLEX MICROSCOPIC  POC OCCULT BLOOD, ED    EKG EKG Interpretation  Date/Time:  Sunday April 26 2022 09:35:16 EDT Ventricular Rate:  76 PR Interval:  163 QRS Duration: 167 QT Interval:  443 QTC Calculation: 499 R Axis:   -77 Text Interpretation: Right bundle branch block REPOLARIZATION ABNORMALITY No significant change since last tracing 16 April 2022 Confirmed by Margarita Grizzle [phone]) on 04/26/2022 12:58:36 PM  Radiology CT Head Wo Contrast  Result Date: 04/26/2022 CLINICAL DATA:  Syncope EXAM: CT HEAD WITHOUT CONTRAST CT CERVICAL SPINE WITHOUT CONTRAST TECHNIQUE: Multidetector CT imaging of the head and cervical spine was performed following the standard protocol without intravenous contrast. Multiplanar CT image reconstructions of the cervical spine were also generated. RADIATION DOSE REDUCTION: This exam was performed according to the departmental dose-optimization program which includes automated exposure control, adjustment of the mA and/or kV according to patient size and/or use of iterative reconstruction technique. COMPARISON:  None Available.  FINDINGS: CT HEAD FINDINGS Brain: There is no acute intracranial hemorrhage, extra-axial fluid collection, or acute infarct. Parenchymal volume is normal. The ventricles are normal in size. Gray-white differentiation is preserved. There is a small remote lacunar infarct versus prominent perivascular space in the right lentiform nucleus. There is no mass lesion.  There is no mass effect or midline shift. Vascular: There is calcification of the bilateral carotid siphons. Skull: Normal. Negative for fracture or focal lesion. Sinuses/Orbits: There is mucosal thickening in  the paranasal sinuses with layering fluid in the left sphenoid sinus. The globes and orbits are unremarkable. Other: None. CT CERVICAL SPINE FINDINGS Alignment: Normal. There is no jumped or perched facet or other evidence of traumatic malalignment. Skull base and vertebrae: Skull base alignment is maintained. There is mild compression deformity of the superior T1 and T2 endplates which is unchanged compared to prior CT chest. Vertebral body heights are otherwise preserved. There is no evidence of acute fracture. There is no suspicious osseous lesion. Soft tissues and spinal canal: No prevertebral fluid or swelling. No visible canal hematoma. Disc levels: There is disc space narrowing degenerative endplate change most advanced at C5-C6 and C6-C7. There is thickening of the posterior longitudinal ligament with some ossification at these levels with superimposed disc protrusions resulting up to mild-to-moderate spinal canal stenosis C5-C6. Upper chest: The imaged lung apices are clear. Other: None. IMPRESSION: 1. No acute intracranial pathology. 2. No acute fracture or traumatic malalignment of the cervical spine. 3. 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 4. Layering fluid in the left sphenoid sinus which can be seen with acute sinusitis in the correct clinical setting. Electronically  Signed   By: Valetta Mole M.D.   On: 04/26/2022 11:26   CT Cervical Spine Wo Contrast  Result Date: 04/26/2022 CLINICAL DATA:  Syncope EXAM: CT HEAD WITHOUT CONTRAST CT CERVICAL SPINE WITHOUT CONTRAST TECHNIQUE: Multidetector CT imaging of the head and cervical spine was performed following the standard protocol without intravenous contrast. Multiplanar CT image reconstructions of the cervical spine were also generated. RADIATION DOSE REDUCTION: This exam was performed according to the departmental dose-optimization program which includes automated exposure control, adjustment of the mA and/or kV according to patient size and/or use of iterative reconstruction technique. COMPARISON:  None Available. FINDINGS: CT HEAD FINDINGS Brain: There is no acute intracranial hemorrhage, extra-axial fluid collection, or acute infarct. Parenchymal volume is normal. The ventricles are normal in size. Gray-white differentiation is preserved. There is a small remote lacunar infarct versus prominent perivascular space in the right lentiform nucleus. There is no mass lesion.  There is no mass effect or midline shift. Vascular: There is calcification of the bilateral carotid siphons. Skull: Normal. Negative for fracture or focal lesion. Sinuses/Orbits: There is mucosal thickening in the paranasal sinuses with layering fluid in the left sphenoid sinus. The globes and orbits are unremarkable. Other: None. CT CERVICAL SPINE FINDINGS Alignment: Normal. There is no jumped or perched facet or other evidence of traumatic malalignment. Skull base and vertebrae: Skull base alignment is maintained. There is mild compression deformity of the superior T1 and T2 endplates which is unchanged compared to prior CT chest. Vertebral body heights are otherwise preserved. There is no evidence of acute fracture. There is no suspicious osseous lesion. Soft tissues and spinal canal: No prevertebral fluid or swelling. No visible canal hematoma. Disc  levels: There is disc space narrowing degenerative endplate change most advanced at C5-C6 and C6-C7. There is thickening of the posterior longitudinal ligament with some ossification at these levels with superimposed disc protrusions resulting up to mild-to-moderate spinal canal stenosis C5-C6. Upper chest: The imaged lung apices are clear. Other: None. IMPRESSION: 1. No acute intracranial pathology. 2. No acute fracture or traumatic malalignment of the cervical spine. 3. 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 4. Layering fluid in the left sphenoid sinus which can be seen with acute sinusitis in the  correct clinical setting. Electronically Signed   By: Lesia Hausen M.D.   On: 04/26/2022 11:26   DG Chest Port 1 View  Result Date: 04/26/2022 CLINICAL DATA:  Dizziness, syncope EXAM: PORTABLE CHEST 1 VIEW COMPARISON:  CT done on 02/10/2022 FINDINGS: Transverse diameter of heart is slightly increased. There are no signs of pulmonary edema or focal pulmonary consolidation. There is no pleural effusion or pneumothorax. IMPRESSION: No active disease. Electronically Signed   By: Ernie Avena M.D.   On: 04/26/2022 10:11    Procedures Procedures    Medications Ordered in ED Medications  lactated ringers bolus 1,000 mL (0 mLs Intravenous Stopped 04/26/22 1121)    ED Course/ Medical Decision Making/ A&P Clinical Course as of 04/26/22 1300  Sun Apr 26, 2022  1053 CT Head Wo Contrast [DR]  1058 CBC reviewed and interpreted and significant for leukocytosis of 13,700 otherwise within normal limits [DR]  1058 Troponin I (High Sensitivity)(!) First troponin reviewed and interpreted and slightly elevated at 18 awaiting second [DR]  1058 Complete metabolic panel is reviewed and interpreted and is significant for mild hypokalemia with potassium of 3.2 and elevated chloride at 113 Creatinine is normal at 1 Total protein albumin and calcium  are all decreased [DR]  1256 Head CT reviewed interpreted no evidence of acute intracranial pathology and no evidence of acute fracture degenerative changes noted in neck and layering of fluid reports possible sinusitis [DR]  1256 Chest x-Ravin Denardo reviewed interpreted no evidence of acute abnormality is noted [DR]    Clinical Course User Index [DR] Margarita Grizzle, MD                           Medical Decision Making 66 year old male history of Crohn's disease who has been severely calorie restricting.  He got lightheaded today at [facility].  He states that he had been at home and feels like he would have laid down which have been better.  He had a syncopal event.  Felt this is likely secondary to his volume depletion. He was orthostatic on arrival here.  Is received in a liter of IV fluid.  He feels improved. He did have a mild elevation in troponin from 18-21.  I have discussed this with the patient.  He had no chest pain and felt lightheaded prior to this.  I have a low index of suspicion that this was a cardiac event.  I suspect he may have had some low flow during this episode. Patient is scheduled for surgery for his Crohn's on Wednesday.  We have discussed hospitalization versus going home.  Patient's preference is to go home.  I feel that this would be in his best interest.  We have discussed return precautions and need to increase his p.o. intake in the interim. He is in agreement with this plan. Differential diagnosis includes but is not limited to syncope from low flow, orthostasis, I cardiac arrhythmias, electrolyte abnormalities, hypoglycemia. Patient was evaluated with labs, EKG, CT of his head and neck due to the injury from the fall, chest x-Esker Dever,  Amount and/or Complexity of Data Reviewed Labs: ordered. Decision-making details documented in ED Course. Radiology: ordered. Decision-making details documented in ED Course.           Final Clinical Impression(s) / ED Diagnoses Final  diagnoses:  Syncope, unspecified syncope type  Contusion of left periocular region, initial encounter  Leg abrasion, right, initial encounter    Rx / DC Orders ED  Discharge Orders     None         Margarita Grizzle, MD 04/26/22 1300

## 2022-04-26 NOTE — Discharge Instructions (Signed)
You were evaluated here in the department with an EKG which is unchanged from your prior You seem to have some volume depletion which is likely secondary to your severe calorie restriction Please increase your oral intake Return to the emergency department if you are having chest pain, shortness of breath, or feel worse at any time.

## 2022-04-26 NOTE — ED Notes (Signed)
Pt denies dizziness and no visible swaying noted while completing orthostatics. Pt had a 20 pt difference in BP from sitting to standing for 0 minutes. EDP aware

## 2022-04-26 NOTE — ED Notes (Signed)
Patient transported to CT 

## 2022-04-26 NOTE — ED Triage Notes (Signed)
Patient BIB [facility] from [place] while shopping patient stated he didn't feel so well, feeling weak and dizzy and had a syncopal episode, stood up again, and had another syncopal episode. Per EMS patient has been practicing a low calorie diet due to his crohn's disease- is supposed to undergo a bowel resection soon.  VS: BP:110/60 HR:90 CBG:98 SpO2:98%

## 2022-04-29 ENCOUNTER — Other Ambulatory Visit: Payer: Self-pay

## 2022-04-29 ENCOUNTER — Inpatient Hospital Stay (HOSPITAL_COMMUNITY): Payer: Medicare Other | Admitting: Physician Assistant

## 2022-04-29 ENCOUNTER — Encounter (HOSPITAL_COMMUNITY)
Admission: RE | Disposition: A | Discharge status: Home / Self Care | Payer: Self-pay | Source: Home / Self Care | Attending: Surgery

## 2022-04-29 ENCOUNTER — Encounter (HOSPITAL_COMMUNITY): Payer: Self-pay | Admitting: Surgery

## 2022-04-29 ENCOUNTER — Inpatient Hospital Stay (HOSPITAL_COMMUNITY)
Admission: RE | Admit: 2022-04-29 | Discharge: 2022-05-05 | DRG: 330 | Disposition: A | Discharge status: Home / Self Care | Payer: Medicare Other | Attending: Surgery | Admitting: Surgery

## 2022-04-29 ENCOUNTER — Inpatient Hospital Stay (HOSPITAL_COMMUNITY): Payer: Medicare Other

## 2022-04-29 ENCOUNTER — Inpatient Hospital Stay (HOSPITAL_COMMUNITY): Payer: Medicare Other | Admitting: Certified Registered"

## 2022-04-29 ENCOUNTER — Other Ambulatory Visit (HOSPITAL_COMMUNITY): Payer: Self-pay

## 2022-04-29 DIAGNOSIS — R918 Other nonspecific abnormal finding of lung field: Secondary | ICD-10-CM | POA: Diagnosis present

## 2022-04-29 DIAGNOSIS — D638 Anemia in other chronic diseases classified elsewhere: Secondary | ICD-10-CM | POA: Diagnosis present

## 2022-04-29 DIAGNOSIS — I739 Peripheral vascular disease, unspecified: Secondary | ICD-10-CM | POA: Diagnosis present

## 2022-04-29 DIAGNOSIS — Z833 Family history of diabetes mellitus: Secondary | ICD-10-CM

## 2022-04-29 DIAGNOSIS — K50913 Crohn's disease, unspecified, with fistula: Secondary | ICD-10-CM | POA: Diagnosis not present

## 2022-04-29 DIAGNOSIS — K8020 Calculus of gallbladder without cholecystitis without obstruction: Secondary | ICD-10-CM | POA: Diagnosis present

## 2022-04-29 DIAGNOSIS — Z20822 Contact with and (suspected) exposure to covid-19: Secondary | ICD-10-CM | POA: Diagnosis present

## 2022-04-29 DIAGNOSIS — E876 Hypokalemia: Secondary | ICD-10-CM | POA: Diagnosis present

## 2022-04-29 DIAGNOSIS — D849 Immunodeficiency, unspecified: Secondary | ICD-10-CM | POA: Diagnosis present

## 2022-04-29 DIAGNOSIS — Z803 Family history of malignant neoplasm of breast: Secondary | ICD-10-CM

## 2022-04-29 DIAGNOSIS — F17210 Nicotine dependence, cigarettes, uncomplicated: Secondary | ICD-10-CM | POA: Diagnosis present

## 2022-04-29 DIAGNOSIS — I10 Essential (primary) hypertension: Secondary | ICD-10-CM | POA: Diagnosis present

## 2022-04-29 DIAGNOSIS — Z886 Allergy status to analgesic agent status: Secondary | ICD-10-CM | POA: Diagnosis not present

## 2022-04-29 DIAGNOSIS — Z8249 Family history of ischemic heart disease and other diseases of the circulatory system: Secondary | ICD-10-CM

## 2022-04-29 DIAGNOSIS — E785 Hyperlipidemia, unspecified: Secondary | ICD-10-CM | POA: Diagnosis present

## 2022-04-29 DIAGNOSIS — K660 Peritoneal adhesions (postprocedural) (postinfection): Secondary | ICD-10-CM | POA: Diagnosis present

## 2022-04-29 DIAGNOSIS — Z880 Allergy status to penicillin: Secondary | ICD-10-CM | POA: Diagnosis not present

## 2022-04-29 DIAGNOSIS — Z6823 Body mass index (BMI) 23.0-23.9, adult: Secondary | ICD-10-CM

## 2022-04-29 DIAGNOSIS — K9189 Other postprocedural complications and disorders of digestive system: Secondary | ICD-10-CM | POA: Diagnosis not present

## 2022-04-29 DIAGNOSIS — D509 Iron deficiency anemia, unspecified: Secondary | ICD-10-CM | POA: Diagnosis present

## 2022-04-29 DIAGNOSIS — K219 Gastro-esophageal reflux disease without esophagitis: Secondary | ICD-10-CM | POA: Diagnosis present

## 2022-04-29 DIAGNOSIS — E440 Moderate protein-calorie malnutrition: Secondary | ICD-10-CM | POA: Diagnosis present

## 2022-04-29 DIAGNOSIS — K50014 Crohn's disease of small intestine with abscess: Secondary | ICD-10-CM | POA: Diagnosis present

## 2022-04-29 DIAGNOSIS — K50012 Crohn's disease of small intestine with intestinal obstruction: Principal | ICD-10-CM | POA: Diagnosis present

## 2022-04-29 DIAGNOSIS — Z823 Family history of stroke: Secondary | ICD-10-CM | POA: Diagnosis not present

## 2022-04-29 DIAGNOSIS — Z800 Family history of malignant neoplasm of digestive organs: Secondary | ICD-10-CM | POA: Diagnosis not present

## 2022-04-29 SURGERY — COLECTOMY, PARTIAL, ROBOT-ASSISTED, LAPAROSCOPIC
Anesthesia: General | Site: Abdomen

## 2022-04-29 MED ORDER — OXYCODONE HCL 5 MG/5ML PO SOLN: 5.0000 mg | Freq: Once | ORAL | Status: DC | PRN

## 2022-04-29 MED ORDER — PROPOFOL 10 MG/ML IV BOLUS
INTRAVENOUS | Status: DC | PRN
  Administered 2022-04-29: 130 mg via INTRAVENOUS

## 2022-04-29 MED ORDER — PROPOFOL 10 MG/ML IV BOLUS
INTRAVENOUS | Status: AC
  Filled 2022-04-29: qty 20

## 2022-04-29 MED ORDER — PHENYLEPHRINE HCL-NACL 20-0.9 MG/250ML-% IV SOLN
INTRAVENOUS | Status: DC | PRN
  Administered 2022-04-29: 20 ug/min via INTRAVENOUS

## 2022-04-29 MED ORDER — ORAL CARE MOUTH RINSE: 15.0000 mL | Freq: Once | OROMUCOSAL | Status: AC

## 2022-04-29 MED ORDER — MIDAZOLAM HCL 2 MG/2ML IJ SOLN
INTRAMUSCULAR | Status: AC
  Filled 2022-04-29: qty 2

## 2022-04-29 MED ORDER — LACTATED RINGERS IV SOLN: INTRAVENOUS | Status: DC

## 2022-04-29 MED ORDER — ENOXAPARIN SODIUM 40 MG/0.4ML IJ SOSY
40.0000 mg | PREFILLED_SYRINGE | Freq: Once | INTRAMUSCULAR | Status: AC
  Administered 2022-04-29: 40 mg via SUBCUTANEOUS
  Filled 2022-04-29: qty 0.4

## 2022-04-29 MED ORDER — SODIUM CHLORIDE 0.9 % IV SOLN
1.0000 g | INTRAVENOUS | Status: AC
  Administered 2022-04-29: 1 g via INTRAVENOUS
  Filled 2022-04-29: qty 1

## 2022-04-29 MED ORDER — LACTATED RINGERS IR SOLN
Status: DC | PRN
  Administered 2022-04-29: 1000 mL

## 2022-04-29 MED ORDER — B-12 5000 MCG SL SUBL: 5000.0000 ug | SUBLINGUAL_TABLET | Freq: Every morning | SUBLINGUAL | Status: DC

## 2022-04-29 MED ORDER — METHOCARBAMOL 1000 MG/10ML IJ SOLN: 1000.0000 mg | Freq: Four times a day (QID) | INTRAVENOUS | Status: DC | PRN

## 2022-04-29 MED ORDER — ENSURE SURGERY PO LIQD
237.0000 mL | Freq: Two times a day (BID) | ORAL | Status: DC
  Administered 2022-05-01 – 2022-05-02 (×4): 237 mL via ORAL

## 2022-04-29 MED ORDER — ONDANSETRON HCL 4 MG/2ML IJ SOLN
INTRAMUSCULAR | Status: AC
  Filled 2022-04-29: qty 2

## 2022-04-29 MED ORDER — ROSUVASTATIN CALCIUM 10 MG PO TABS
10.0000 mg | ORAL_TABLET | Freq: Every day | ORAL | Status: DC
  Administered 2022-04-30 – 2022-05-04 (×5): 10 mg via ORAL
  Filled 2022-04-29 (×5): qty 1

## 2022-04-29 MED ORDER — GABAPENTIN 300 MG PO CAPS
300.0000 mg | ORAL_CAPSULE | ORAL | Status: AC
  Administered 2022-04-29: 300 mg via ORAL
  Filled 2022-04-29: qty 1

## 2022-04-29 MED ORDER — LIDOCAINE 2% (20 MG/ML) 5 ML SYRINGE
INTRAMUSCULAR | Status: DC | PRN
  Administered 2022-04-29: 40 mg via INTRAVENOUS

## 2022-04-29 MED ORDER — PROMETHAZINE HCL 25 MG/ML IJ SOLN: 6.2500 mg | INTRAMUSCULAR | Status: DC | PRN

## 2022-04-29 MED ORDER — ALVIMOPAN 12 MG PO CAPS
12.0000 mg | ORAL_CAPSULE | Freq: Two times a day (BID) | ORAL | Status: DC
  Administered 2022-04-30 – 2022-05-03 (×7): 12 mg via ORAL
  Filled 2022-04-29 (×8): qty 1

## 2022-04-29 MED ORDER — LIP MEDEX EX OINT
TOPICAL_OINTMENT | Freq: Two times a day (BID) | CUTANEOUS | Status: DC
  Administered 2022-04-29 – 2022-04-30 (×2): 75 via TOPICAL
  Administered 2022-05-03 – 2022-05-04 (×2): 1 via TOPICAL
  Filled 2022-04-29: qty 7

## 2022-04-29 MED ORDER — ACETAMINOPHEN 500 MG PO TABS
1000.0000 mg | ORAL_TABLET | Freq: Four times a day (QID) | ORAL | Status: DC
  Administered 2022-04-30 – 2022-05-05 (×19): 1000 mg via ORAL
  Filled 2022-04-29 (×20): qty 2

## 2022-04-29 MED ORDER — BUPIVACAINE-EPINEPHRINE (PF) 0.25% -1:200000 IJ SOLN
INTRAMUSCULAR | Status: AC
  Filled 2022-04-29: qty 60

## 2022-04-29 MED ORDER — CALCIUM POLYCARBOPHIL 625 MG PO TABS
625.0000 mg | ORAL_TABLET | Freq: Two times a day (BID) | ORAL | Status: DC
  Administered 2022-04-30 – 2022-05-05 (×11): 625 mg via ORAL
  Filled 2022-04-29 (×11): qty 1

## 2022-04-29 MED ORDER — ACETAMINOPHEN 500 MG PO TABS
1000.0000 mg | ORAL_TABLET | ORAL | Status: AC
  Administered 2022-04-29: 1000 mg via ORAL
  Filled 2022-04-29: qty 2

## 2022-04-29 MED ORDER — TRAMADOL HCL 50 MG PO TABS
50.0000 mg | ORAL_TABLET | Freq: Four times a day (QID) | ORAL | 0 refills | Status: AC | PRN
  Filled 2022-04-29: qty 20, 3d supply, fill #0

## 2022-04-29 MED ORDER — MELATONIN 3 MG PO TABS
3.0000 mg | ORAL_TABLET | Freq: Every evening | ORAL | Status: DC | PRN
  Administered 2022-04-30 – 2022-05-01 (×2): 3 mg via ORAL
  Filled 2022-04-29 (×2): qty 1

## 2022-04-29 MED ORDER — ONDANSETRON HCL 4 MG PO TABS: 4.0000 mg | ORAL_TABLET | Freq: Four times a day (QID) | ORAL | Status: DC | PRN

## 2022-04-29 MED ORDER — ENSURE PRE-SURGERY PO LIQD: 592.0000 mL | Freq: Once | ORAL | Status: DC

## 2022-04-29 MED ORDER — MAGIC MOUTHWASH: 15.0000 mL | Freq: Four times a day (QID) | ORAL | Status: DC | PRN

## 2022-04-29 MED ORDER — METHOCARBAMOL 500 MG PO TABS: 1000.0000 mg | ORAL_TABLET | Freq: Four times a day (QID) | ORAL | Status: DC | PRN

## 2022-04-29 MED ORDER — BUPIVACAINE-EPINEPHRINE (PF) 0.25% -1:200000 IJ SOLN
INTRAMUSCULAR | Status: DC | PRN
  Administered 2022-04-29: 60 mL

## 2022-04-29 MED ORDER — LACTATED RINGERS IV BOLUS: 1000.0000 mL | Freq: Three times a day (TID) | INTRAVENOUS | Status: AC | PRN

## 2022-04-29 MED ORDER — ALBUMIN HUMAN 5 % IV SOLN: INTRAVENOUS | Status: DC | PRN

## 2022-04-29 MED ORDER — LACTATED RINGERS IV SOLN: INTRAVENOUS | Status: DC | PRN

## 2022-04-29 MED ORDER — DEXAMETHASONE SODIUM PHOSPHATE 10 MG/ML IJ SOLN
INTRAMUSCULAR | Status: AC
  Filled 2022-04-29: qty 1

## 2022-04-29 MED ORDER — ONDANSETRON HCL 4 MG/2ML IJ SOLN
4.0000 mg | Freq: Four times a day (QID) | INTRAMUSCULAR | Status: DC | PRN
  Administered 2022-04-29: 4 mg via INTRAVENOUS
  Filled 2022-04-29: qty 2

## 2022-04-29 MED ORDER — MEPERIDINE HCL 50 MG/ML IJ SOLN: 6.2500 mg | INTRAMUSCULAR | Status: DC | PRN

## 2022-04-29 MED ORDER — FERROUS SULFATE 325 (65 FE) MG PO TABS
325.0000 mg | ORAL_TABLET | Freq: Every morning | ORAL | Status: DC
  Administered 2022-04-30 – 2022-05-01 (×2): 325 mg via ORAL
  Filled 2022-04-29 (×2): qty 1

## 2022-04-29 MED ORDER — ENSURE PRE-SURGERY PO LIQD: 296.0000 mL | Freq: Once | ORAL | Status: DC

## 2022-04-29 MED ORDER — ALUM & MAG HYDROXIDE-SIMETH 200-200-20 MG/5ML PO SUSP: 30.0000 mL | Freq: Four times a day (QID) | ORAL | Status: DC | PRN

## 2022-04-29 MED ORDER — DIPHENHYDRAMINE HCL 50 MG/ML IJ SOLN: 12.5000 mg | Freq: Four times a day (QID) | INTRAMUSCULAR | Status: DC | PRN

## 2022-04-29 MED ORDER — SIMETHICONE 80 MG PO CHEW: 40.0000 mg | CHEWABLE_TABLET | Freq: Four times a day (QID) | ORAL | Status: DC | PRN

## 2022-04-29 MED ORDER — HYDRALAZINE HCL 20 MG/ML IJ SOLN: 10.0000 mg | INTRAMUSCULAR | Status: DC | PRN

## 2022-04-29 MED ORDER — BUPIVACAINE LIPOSOME 1.3 % IJ SUSP: 20.0000 mL | Freq: Once | INTRAMUSCULAR | Status: DC

## 2022-04-29 MED ORDER — VITAMIN B-12 1000 MCG PO TABS
5000.0000 ug | ORAL_TABLET | Freq: Every day | ORAL | Status: DC
  Administered 2022-05-01 – 2022-05-05 (×5): 5000 ug via ORAL
  Filled 2022-04-29 (×5): qty 5

## 2022-04-29 MED ORDER — PROCHLORPERAZINE EDISYLATE 10 MG/2ML IJ SOLN: 5.0000 mg | Freq: Four times a day (QID) | INTRAMUSCULAR | Status: DC | PRN

## 2022-04-29 MED ORDER — DEXAMETHASONE SODIUM PHOSPHATE 10 MG/ML IJ SOLN
INTRAMUSCULAR | Status: DC | PRN
  Administered 2022-04-29: 4 mg via INTRAVENOUS

## 2022-04-29 MED ORDER — METOPROLOL TARTRATE 5 MG/5ML IV SOLN: 5.0000 mg | Freq: Four times a day (QID) | INTRAVENOUS | Status: DC | PRN

## 2022-04-29 MED ORDER — BUPIVACAINE LIPOSOME 1.3 % IJ SUSP
INTRAMUSCULAR | Status: AC
  Filled 2022-04-29: qty 20

## 2022-04-29 MED ORDER — ALBUMIN HUMAN 5 % IV SOLN
INTRAVENOUS | Status: AC
  Filled 2022-04-29: qty 250

## 2022-04-29 MED ORDER — OXYCODONE HCL 5 MG PO TABS: 5.0000 mg | ORAL_TABLET | Freq: Once | ORAL | Status: DC | PRN

## 2022-04-29 MED ORDER — PROCHLORPERAZINE MALEATE 10 MG PO TABS: 10.0000 mg | ORAL_TABLET | Freq: Four times a day (QID) | ORAL | Status: DC | PRN

## 2022-04-29 MED ORDER — MIDAZOLAM HCL 2 MG/2ML IJ SOLN
2.0000 mg | Freq: Once | INTRAMUSCULAR | Status: AC
  Administered 2022-04-29: 2 mg via INTRAVENOUS

## 2022-04-29 MED ORDER — ROCURONIUM BROMIDE 10 MG/ML (PF) SYRINGE
PREFILLED_SYRINGE | INTRAVENOUS | Status: DC | PRN
  Administered 2022-04-29: 30 mg via INTRAVENOUS
  Administered 2022-04-29: 70 mg via INTRAVENOUS
  Administered 2022-04-29 (×3): 20 mg via INTRAVENOUS

## 2022-04-29 MED ORDER — MIDAZOLAM HCL 2 MG/2ML IJ SOLN: 0.5000 mg | Freq: Once | INTRAMUSCULAR | Status: DC | PRN

## 2022-04-29 MED ORDER — ROCURONIUM BROMIDE 10 MG/ML (PF) SYRINGE
PREFILLED_SYRINGE | INTRAVENOUS | Status: AC
  Filled 2022-04-29: qty 10

## 2022-04-29 MED ORDER — MIDAZOLAM HCL 2 MG/2ML IJ SOLN
INTRAMUSCULAR | Status: DC | PRN
  Administered 2022-04-29 (×2): 1 mg via INTRAVENOUS

## 2022-04-29 MED ORDER — SUGAMMADEX SODIUM 200 MG/2ML IV SOLN
INTRAVENOUS | Status: DC | PRN
  Administered 2022-04-29: 200 mg via INTRAVENOUS

## 2022-04-29 MED ORDER — LACTATED RINGERS IV SOLN: INTRAVENOUS | Status: AC

## 2022-04-29 MED ORDER — ADULT MULTIVITAMIN W/MINERALS CH
1.0000 | ORAL_TABLET | Freq: Every morning | ORAL | Status: DC
  Administered 2022-04-30 – 2022-05-05 (×6): 1 via ORAL
  Filled 2022-04-29 (×6): qty 1

## 2022-04-29 MED ORDER — FENTANYL CITRATE (PF) 250 MCG/5ML IJ SOLN
INTRAMUSCULAR | Status: DC | PRN
  Administered 2022-04-29 (×2): 50 ug via INTRAVENOUS
  Administered 2022-04-29: 100 ug via INTRAVENOUS

## 2022-04-29 MED ORDER — FENTANYL CITRATE (PF) 100 MCG/2ML IJ SOLN
INTRAMUSCULAR | Status: AC
  Filled 2022-04-29: qty 2

## 2022-04-29 MED ORDER — TRAMADOL HCL 50 MG PO TABS
50.0000 mg | ORAL_TABLET | Freq: Four times a day (QID) | ORAL | Status: DC | PRN
  Administered 2022-05-01 (×2): 50 mg via ORAL
  Administered 2022-05-02: 100 mg via ORAL
  Filled 2022-04-29: qty 1
  Filled 2022-04-29 (×2): qty 2

## 2022-04-29 MED ORDER — ENOXAPARIN SODIUM 40 MG/0.4ML IJ SOSY
40.0000 mg | PREFILLED_SYRINGE | INTRAMUSCULAR | Status: DC
  Administered 2022-04-30 – 2022-05-05 (×6): 40 mg via SUBCUTANEOUS
  Filled 2022-04-29 (×6): qty 0.4

## 2022-04-29 MED ORDER — ONDANSETRON HCL 4 MG/2ML IJ SOLN
INTRAMUSCULAR | Status: DC | PRN
  Administered 2022-04-29: 4 mg via INTRAVENOUS

## 2022-04-29 MED ORDER — BUPIVACAINE LIPOSOME 1.3 % IJ SUSP
INTRAMUSCULAR | Status: DC | PRN
  Administered 2022-04-29: 20 mL

## 2022-04-29 MED ORDER — KETAMINE HCL 10 MG/ML IJ SOLN
INTRAMUSCULAR | Status: DC | PRN
  Administered 2022-04-29 (×4): 10 mg via INTRAVENOUS

## 2022-04-29 MED ORDER — VITAMIN D3 25 MCG (1000 UNIT) PO TABS
2000.0000 [IU] | ORAL_TABLET | Freq: Every day | ORAL | Status: DC
  Administered 2022-04-30 – 2022-05-04 (×5): 2000 [IU] via ORAL
  Filled 2022-04-29 (×5): qty 2

## 2022-04-29 MED ORDER — HYDROMORPHONE HCL 1 MG/ML IJ SOLN
0.5000 mg | INTRAMUSCULAR | Status: DC | PRN
  Filled 2022-04-29: qty 1

## 2022-04-29 MED ORDER — DIPHENHYDRAMINE HCL 12.5 MG/5ML PO ELIX: 12.5000 mg | ORAL_SOLUTION | Freq: Four times a day (QID) | ORAL | Status: DC | PRN

## 2022-04-29 MED ORDER — CHLORHEXIDINE GLUCONATE 0.12 % MT SOLN
15.0000 mL | Freq: Once | OROMUCOSAL | Status: AC
  Administered 2022-04-29: 15 mL via OROMUCOSAL

## 2022-04-29 MED ORDER — HYDROMORPHONE HCL 1 MG/ML IJ SOLN: 0.2500 mg | INTRAMUSCULAR | Status: DC | PRN

## 2022-04-29 MED ORDER — KETAMINE HCL 10 MG/ML IJ SOLN
INTRAMUSCULAR | Status: AC
  Filled 2022-04-29: qty 1

## 2022-04-29 MED ORDER — ALVIMOPAN 12 MG PO CAPS
12.0000 mg | ORAL_CAPSULE | ORAL | Status: AC
  Administered 2022-04-29: 12 mg via ORAL
  Filled 2022-04-29: qty 1

## 2022-04-29 SURGICAL SUPPLY — 121 items
APPLIER CLIP 5 13 M/L LIGAMAX5 (MISCELLANEOUS)
APPLIER CLIP ROT 10 11.4 M/L (STAPLE)
BAG COUNTER SPONGE SURGICOUNT (BAG) ×1 IMPLANT
BLADE EXTENDED COATED 6.5IN (ELECTRODE) IMPLANT
CANNULA REDUC XI 12-8 STAPL (CANNULA) ×1
CANNULA REDUCER 12-8 DVNC XI (CANNULA) IMPLANT
CELLS DAT CNTRL 66122 CELL SVR (MISCELLANEOUS) ×1 IMPLANT
CHLORAPREP W/TINT 26 (MISCELLANEOUS) IMPLANT
CLIP APPLIE 5 13 M/L LIGAMAX5 (MISCELLANEOUS) IMPLANT
CLIP APPLIE ROT 10 11.4 M/L (STAPLE) IMPLANT
COVER SURGICAL LIGHT HANDLE (MISCELLANEOUS) ×2 IMPLANT
COVER TIP SHEARS 8 DVNC (MISCELLANEOUS) ×1 IMPLANT
COVER TIP SHEARS 8MM DA VINCI (MISCELLANEOUS) ×1
DEVICE TROCAR PUNCTURE CLOSURE (ENDOMECHANICALS) IMPLANT
DRAIN CHANNEL 19F RND (DRAIN) IMPLANT
DRAPE ARM DVNC X/XI (DISPOSABLE) ×4 IMPLANT
DRAPE COLUMN DVNC XI (DISPOSABLE) ×1 IMPLANT
DRAPE DA VINCI XI ARM (DISPOSABLE) ×4
DRAPE DA VINCI XI COLUMN (DISPOSABLE) ×1
DRAPE SURG IRRIG POUCH 19X23 (DRAPES) ×1 IMPLANT
DRSG OPSITE POSTOP 4X10 (GAUZE/BANDAGES/DRESSINGS) IMPLANT
DRSG OPSITE POSTOP 4X6 (GAUZE/BANDAGES/DRESSINGS) IMPLANT
DRSG OPSITE POSTOP 4X8 (GAUZE/BANDAGES/DRESSINGS) IMPLANT
DRSG TEGADERM 2-3/8X2-3/4 SM (GAUZE/BANDAGES/DRESSINGS) ×5 IMPLANT
DRSG TEGADERM 4X4.75 (GAUZE/BANDAGES/DRESSINGS) IMPLANT
ELECT PENCIL ROCKER SW 15FT (MISCELLANEOUS) ×1 IMPLANT
ELECT REM PT RETURN 15FT ADLT (MISCELLANEOUS) ×1 IMPLANT
ENDOLOOP SUT PDS II  0 18 (SUTURE)
ENDOLOOP SUT PDS II 0 18 (SUTURE) IMPLANT
EVACUATOR SILICONE 100CC (DRAIN) IMPLANT
GAUZE SPONGE 2X2 8PLY STRL LF (GAUZE/BANDAGES/DRESSINGS) ×1 IMPLANT
GLOVE ECLIPSE 8.0 STRL XLNG CF (GLOVE) ×3 IMPLANT
GLOVE INDICATOR 8.0 STRL GRN (GLOVE) ×3 IMPLANT
GOWN SRG XL LVL 4 BRTHBL STRL (GOWNS) ×1 IMPLANT
GOWN STRL NON-REIN XL LVL4 (GOWNS) ×1
GOWN STRL REUS W/ TWL XL LVL3 (GOWN DISPOSABLE) ×4 IMPLANT
GOWN STRL REUS W/TWL XL LVL3 (GOWN DISPOSABLE) ×4
GRASPER SUT TROCAR 14GX15 (MISCELLANEOUS) IMPLANT
HANDLE SUCTION POOLE (INSTRUMENTS) IMPLANT
HOLDER FOLEY CATH W/STRAP (MISCELLANEOUS) ×1 IMPLANT
IRRIG SUCT STRYKERFLOW 2 WTIP (MISCELLANEOUS) ×1
IRRIGATION SUCT STRKRFLW 2 WTP (MISCELLANEOUS) ×1 IMPLANT
KIT PROCEDURE DA VINCI SI (MISCELLANEOUS)
KIT PROCEDURE DVNC SI (MISCELLANEOUS) IMPLANT
KIT TURNOVER KIT A (KITS) IMPLANT
LIGASURE IMPACT 36 18CM CVD LR (INSTRUMENTS) IMPLANT
NDL INSUFFLATION 14GA 120MM (NEEDLE) ×1 IMPLANT
NEEDLE INSUFFLATION 14GA 120MM (NEEDLE) ×1 IMPLANT
PACK CARDIOVASCULAR III (CUSTOM PROCEDURE TRAY) ×1 IMPLANT
PACK COLON (CUSTOM PROCEDURE TRAY) ×1 IMPLANT
PAD POSITIONING PINK XL (MISCELLANEOUS) ×1 IMPLANT
PROTECTOR NERVE ULNAR (MISCELLANEOUS) ×2 IMPLANT
RELOAD PROXIMATE 75MM BLUE (ENDOMECHANICALS) ×1 IMPLANT
RELOAD STAPLE 45 3.5 BLU DVNC (STAPLE) IMPLANT
RELOAD STAPLE 45 4.3 GRN DVNC (STAPLE) IMPLANT
RELOAD STAPLE 60 3.5 BLU DVNC (STAPLE) IMPLANT
RELOAD STAPLE 60 4.3 GRN DVNC (STAPLE) IMPLANT
RELOAD STAPLE 75 3.8 BLU REG (ENDOMECHANICALS) IMPLANT
RELOAD STAPLER 3.5X45 BLU DVNC (STAPLE) IMPLANT
RELOAD STAPLER 3.5X60 BLU DVNC (STAPLE) IMPLANT
RELOAD STAPLER 4.3X45 GRN DVNC (STAPLE) IMPLANT
RELOAD STAPLER 4.3X60 GRN DVNC (STAPLE) IMPLANT
RETRACTOR WND ALEXIS 18 MED (MISCELLANEOUS) IMPLANT
RETRACTOR WND ALEXIS 25 LRG (MISCELLANEOUS) IMPLANT
RTRCTR WOUND ALEXIS 18CM MED (MISCELLANEOUS) ×1
RTRCTR WOUND ALEXIS 25CM LRG (MISCELLANEOUS) ×1
SCISSORS LAP 5X35 DISP (ENDOMECHANICALS) ×1 IMPLANT
SEAL CANN UNIV 5-8 DVNC XI (MISCELLANEOUS) ×3 IMPLANT
SEAL XI 5MM-8MM UNIVERSAL (MISCELLANEOUS) ×4
SEALER VESSEL DA VINCI XI (MISCELLANEOUS) ×1
SEALER VESSEL EXT DVNC XI (MISCELLANEOUS) ×1 IMPLANT
SOLUTION ELECTROLUBE (MISCELLANEOUS) ×1 IMPLANT
SPIKE FLUID TRANSFER (MISCELLANEOUS) ×1 IMPLANT
STAPLER 60 DA VINCI SURE FORM (STAPLE) ×1
STAPLER 60 SUREFORM DVNC (STAPLE) IMPLANT
STAPLER 90 3.5 STAND SLIM (STAPLE) ×1
STAPLER 90 3.5 STD SLIM (STAPLE) IMPLANT
STAPLER CANNULA SEAL DVNC XI (STAPLE) ×1 IMPLANT
STAPLER CANNULA SEAL XI (STAPLE) ×1
STAPLER ECHELON POWER CIR 29 (STAPLE) IMPLANT
STAPLER ECHELON POWER CIR 31 (STAPLE) IMPLANT
STAPLER PROXIMATE 75MM BLUE (STAPLE) IMPLANT
STAPLER RELOAD 3.5X45 BLU DVNC (STAPLE)
STAPLER RELOAD 3.5X45 BLUE (STAPLE)
STAPLER RELOAD 3.5X60 BLU DVNC (STAPLE)
STAPLER RELOAD 3.5X60 BLUE (STAPLE)
STAPLER RELOAD 4.3X45 GREEN (STAPLE)
STAPLER RELOAD 4.3X45 GRN DVNC (STAPLE)
STAPLER RELOAD 4.3X60 GREEN (STAPLE)
STAPLER RELOAD 4.3X60 GRN DVNC (STAPLE)
STOPCOCK 4 WAY LG BORE MALE ST (IV SETS) ×2 IMPLANT
SUCTION POOLE HANDLE (INSTRUMENTS) ×1
SURGILUBE 2OZ TUBE FLIPTOP (MISCELLANEOUS) IMPLANT
SUT MNCRL AB 4-0 PS2 18 (SUTURE) ×1 IMPLANT
SUT PDS AB 1 CT1 27 (SUTURE) ×2 IMPLANT
SUT PROLENE 0 CT 2 (SUTURE) IMPLANT
SUT PROLENE 2 0 KS (SUTURE) IMPLANT
SUT PROLENE 2 0 SH DA (SUTURE) IMPLANT
SUT SILK 2 0 (SUTURE)
SUT SILK 2 0 SH CR/8 (SUTURE) IMPLANT
SUT SILK 2-0 18XBRD TIE 12 (SUTURE) IMPLANT
SUT SILK 3 0 (SUTURE)
SUT SILK 3 0 SH CR/8 (SUTURE) ×1 IMPLANT
SUT SILK 3-0 18XBRD TIE 12 (SUTURE) IMPLANT
SUT V-LOC BARB 180 2/0GR6 GS22 (SUTURE)
SUT VIC AB 3-0 SH 18 (SUTURE) IMPLANT
SUT VIC AB 3-0 SH 27 (SUTURE)
SUT VIC AB 3-0 SH 27XBRD (SUTURE) IMPLANT
SUT VICRYL 0 UR6 27IN ABS (SUTURE) ×1 IMPLANT
SUTURE V-LC BRB 180 2/0GR6GS22 (SUTURE) IMPLANT
SYR 10ML ECCENTRIC (SYRINGE) ×1 IMPLANT
SYS LAPSCP GELPORT 120MM (MISCELLANEOUS)
SYS WOUND ALEXIS 18CM MED (MISCELLANEOUS) ×1
SYSTEM LAPSCP GELPORT 120MM (MISCELLANEOUS) IMPLANT
SYSTEM WOUND ALEXIS 18CM MED (MISCELLANEOUS) ×1 IMPLANT
TOWEL OR 17X24 6PK STRL BLUE (TOWEL DISPOSABLE) IMPLANT
TOWEL OR NON WOVEN STRL DISP B (DISPOSABLE) ×1 IMPLANT
TRAY FOLEY MTR SLVR 16FR STAT (SET/KITS/TRAYS/PACK) ×1 IMPLANT
TROCAR ADV FIXATION 5X100MM (TROCAR) ×1 IMPLANT
TUBING CONNECTING 10 (TUBING) ×2 IMPLANT
TUBING INSUFFLATION 10FT LAP (TUBING) ×1 IMPLANT

## 2022-04-29 NOTE — Anesthesia Procedure Notes (Signed)
Procedure Name: Intubation Date/Time: 04/29/2022 12:18 PM  Performed by: Eben Burow, CRNAPre-anesthesia Checklist: Patient identified, Emergency Drugs available, Suction available, Patient being monitored and Timeout performed Patient Re-evaluated:Patient Re-evaluated prior to induction Oxygen Delivery Method: Circle system utilized Preoxygenation: Pre-oxygenation with 100% oxygen Induction Type: IV induction Ventilation: Mask ventilation without difficulty Laryngoscope Size: Glidescope and 4 Grade View: Grade I Tube type: Oral Tube size: 7.5 mm Number of attempts: 1 Airway Equipment and Method: Stylet Placement Confirmation: ETT inserted through vocal cords under direct vision, positive ETCO2 and breath sounds checked- equal and bilateral Secured at: 23 cm Tube secured with: Tape Dental Injury: Teeth and Oropharynx as per pre-operative assessment

## 2022-04-29 NOTE — Transfer of Care (Signed)
Immediate Anesthesia Transfer of Care Note  Patient: Calvin Brooks  Procedure(s) Performed: ROBOTIC ILEOCECECTOMY OF CROHNS STRICTURES AND LYSIS OF ADHESIONS (Abdomen)  Patient Location: PACU  Anesthesia Type:General  Level of Consciousness: drowsy and responds to stimulation  Airway & Oxygen Therapy: Patient Spontanous Breathing and Patient connected to face mask oxygen  Post-op Assessment: Report given to RN and Post -op Vital signs reviewed and stable  Post vital signs: Reviewed and stable  Last Vitals:  Vitals Value Taken Time  BP 155/71   Temp    Pulse 79 04/29/22 1637  Resp 16 04/29/22 1637  SpO2 100 % 04/29/22 1637  Vitals shown include unvalidated device data.  Last Pain:  Vitals:   04/29/22 1035  TempSrc:   PainSc: 0-No pain         Complications: No notable events documented.

## 2022-04-29 NOTE — H&P (Signed)
04/29/2022   REFERRING PHYSICIAN: Shirley Friar, MD  Patient Care Team: Eleanora Neighbor as PCP - General Bosie Clos Laurena Bering, MD (Gastroenterology) Claretta Fraise, MD as Surgeon (Colon and Rectal Surgery) Olson Lucarelli, Shawn Route, MD as Consulting Provider (Colon and Rectal Surgery) Shirley Friar, MD (Gastroenterology)  PROVIDER: Jarrett Soho, MD  [facility] MRN: [id number] DOB: 1955/10/27  SUBJECTIVE   Chief Complaint: Colonic Stricture   History of Present Illness: Calvin Brooks is a 66 y.o. male who is seen today  as an office consultation at the request of Dr. Bosie Clos  for evaluation of Colonic Stricture .   With known Crohn's disease of many decades. Sounds like his first resection was in 1987 where he had a fistula to his bladder and required a repeat resection in 1997. Has intermittently been on steroids. Usually on Pentasa. Has been on that for over 4 years. Has used 6-MP and Remicade in the 1990s. Called he had liver problems so no longer on 6-MP. Was followed by Dr. Reece Agar for a while. Colonoscopy 2018 noted stricture at ileocolonic anastomosis in 2018. I believe he was started Pentasa. Not particularly symptomatic. Dr. Laural Benes retired and now followed by [facility] gastro[facility].   Sounds that he was having some abdominal complaints in 2022. Bloating. Abdominal pain. Watery bowel movements. Unintentional weight loss. Seem to improve to steroids. Weight loss stabilized. Try to taper off last July. He was referred to [facility] in August 2022. They recommended colonoscopy to evaluate stricture. They were considering dilatation versus need for further resection. Does not look like that happened.  Patient felt worse this year. CAT scan in May 2023 showed dilated small bowel suspicious for stricture at his ileocolonic anastomosis. Patient placed on steroid taper. Underwent endoscopy 3 months later in August. Stricture noted. Recommendation made for surgical  resection. Recommends tapering off of steroids. He has been on them for the past 2 months but finally stopped last week. He thinks he has been on steroids intermittently 5 times in the past few years. Not started on any biologic  Medical History:  Past Medical History:  Diagnosis Date  Anemia  Hyperlipidemia  Hypertension   There is no problem list on file for this patient.  Past Surgical History:  Procedure Laterality Date  Bowel Resection Surgery  1989/1993?  Bowel Resection Surgery  1997?    Allergies  Allergen Reactions  Penicillins Unknown  Childhood allergy Has patient had a PCN reaction causing immediate rash, facial/tongue/throat swelling, SOB or lightheadedness with hypotension: Unknown Has patient had a PCN reaction causing severe rash involving mucus membranes or skin necrosis: Unknown Has patient had a PCN reaction that required hospitalization: Unknown Has patient had a PCN reaction occurring within the last 10 years: No If all of the above answers are "NO", then may proceed with Cephalosporin use.   Current Outpatient Medications on File Prior to Visit  Medication Sig Dispense Refill  predniSONE (DELTASONE) 10 MG tablet Take 40 mg by mouth once daily  PREVALITE 4 gram oral powder packet DISSOLVE AND TAKE ONE PACKET BY MOUTH TWICE DAILY  rosuvastatin (CRESTOR) 10 MG tablet Take 10 mg by mouth once daily  cyanocobalamin, vitamin B-12, 5,000 mcg TbIE Place under the tongue  PENTASA 500 mg CR capsule TAKE 2 CAPSULES BY MOUTH 4 TIMES DAILY   No current facility-administered medications on file prior to visit.   Family History  Problem Relation Age of Onset  Hyperlipidemia (Elevated cholesterol) Mother  Diabetes Mother  Colon cancer Mother  Stroke Father  High blood pressure (Hypertension) Father  Breast cancer Sister  Obesity Brother  High blood pressure (Hypertension) Brother  Hyperlipidemia (Elevated cholesterol) Brother  Diabetes Brother  Coronary  Artery Disease (Blocked arteries around heart) Brother    Social History   Tobacco Use  Smoking Status Every Day  Types: Cigarettes  Smokeless Tobacco Never    Social History   Socioeconomic History  Marital status: Divorced  Tobacco Use  Smoking status: Every Day  Types: Cigarettes  Smokeless tobacco: Never  Substance and Sexual Activity  Alcohol use: Never  Drug use: Never   ############################################################  Review of Systems: A complete review of systems (ROS) was obtained from the patient. I have reviewed this information and discussed as appropriate with the patient. See HPI as well for other pertinent ROS.  Constitutional: No fevers, chills, sweats. Weight stable Eyes: No vision changes, No discharge HENT: No sore throats, nasal drainage Lymph: No neck swelling, No bruising easily Pulmonary: No cough, productive sputum CV: No orthopnea, PND . No exertional chest/neck/shoulder/arm pain. Patient can walk 20 minutes gradually.   GI: No personal nor family history of GI/colon cancer, irritable bowel syndrome, allergy such as Celiac Sprue, dietary/dairy problems, colitis, ulcers nor gastritis. No recent sick contacts/gastroenteritis. No travel outside the country. No changes in diet.  Renal: No UTIs, No hematuria Genital: No drainage, bleeding, masses Musculoskeletal: No severe joint pain. Good ROM major joints Skin: No sores or lesions Heme/Lymph: No easy bleeding. No swollen lymph nodes Neuro: No active seizures. No facial droop Psych: No hallucinations. No agitation  OBJECTIVE   Vitals:  03/23/22 1334  BP: 130/72  Pulse: 109  Temp: 36.8 C (98.2 F)  SpO2: 97%  Weight: 98.2 kg (216 lb 9.6 oz)  Height: 193 cm (6\' 4" )   Body mass index is 26.37 kg/m.  PHYSICAL EXAM:  Constitutional: Not cachectic. Hygeine adequate. Vitals signs as above.  Eyes: Wears glasses - vision corrected,Pupils reactive, normal extraocular movements.  Sclera nonicteric Neuro: CN II-XII intact. No major focal sensory defects. No major motor deficits. Lymph: No head/neck/groin lymphadenopathy Psych: No severe agitation. No severe anxiety. Judgment & insight Adequate, Oriented x4, HENT: Normocephalic, Mucus membranes moist. No thrush. Hearing: adequate Neck: Supple, No tracheal deviation. No obvious thyromegaly Chest: No pain to chest wall compression. Good respiratory excursion. No audible wheezing CV: Pulses intact. regular. No major extremity edema Ext: No obvious deformity or contracture. Edema: Not present. No cyanosis Skin: No major subcutaneous nodules. Warm and dry Musculoskeletal: Severe joint rigidity not present. No obvious clubbing. No digital petechiae. Mobility: no assist device moving easily without restrictions  Abdomen: Flat Soft. Moderately distended. Nontender. Hernia: Not present. Diastasis recti: Not present. No hepatomegaly. No splenomegaly.  Genital/Pelvic: Inguinal hernia: Not present. Inguinal lymph nodes: without lymphadenopathy nor hidradenitis.   Rectal: (Deferred)    ###################################################################  Labs, Imaging and Diagnostic Testing:  Located in 'Care Everywhere' section of Epic EMR chart  PRIOR [facility] CLINIC NOTES:  Not applicable  SURGERY NOTES:  Not applicable  PATHOLOGY:  Located in 'Care Everywhere' section of Epic EMR chart  Assessment and Plan:  DIAGNOSES:  There are no diagnoses linked to this encounter.   ASSESSMENT/PLAN  Patient with long history of Crohn's disease with 2 prior resections with stricture at ileocolonic anastomosis.  Looking at the CT enterography in past - latest 2022 did not note to strictured areas in the ileum. The one closest to anastomosis is about 9 cm. There have been discussed about endoscopic balloon  dilatation at [facility] but that never happened. He is on his second (fifth?) course of steroids. There is a feeling of futility  that he will not be steroid responsive.  Plan for resection of his stricture near his ileocolonic anastomosis and perhaps another resection for the other ileal stricture versus stricturoplasty. He has been on steroids numerous times but cannot get off them. He has a longer stricture now. Reasonable to start out with a robotic minimal invasive approach in the hopes of resecting doing intracorporeal anastomosis.  The anatomy & physiology of the digestive tract was discussed. The pathophysiology of the colon was discussed. Natural history risks without surgery was discussed. I feel the risks of no intervention will lead to serious problems that outweigh the operative risks; therefore, I recommended a resection to remove the pathology. Minimally invasive (Robotic/Laparoscopic) & open techniques were discussed.   Risks such as bleeding, infection, abscess, leak, reoperation, injury to other organs, need for repair of tissues / organs, possible ostomy, hernia, heart attack, stroke, death, and other risks were discussed. I noted a good likelihood this will help address the problem. Goals of post-operative recovery were discussed as well. Need for adequate nutrition, daily bowel regimen and healthy physical activity, to optimize recovery was noted as well. We will work to minimize complications. Educational materials were available as well. Questions were answered. The patient expresses understanding & wishes to proceed with surgery.  Adin Hector, MD, FACS, MASCRS Esophageal, Gastrointestinal & Colorectal Surgery Robotic and Minimally Invasive Surgery  [facility] [address], [place], [place] [address] [phone] Fax [phone] Main  CONTACT INFORMATION:  Weekday (9AM-5PM): Call [facility] main office at [phone]  Weeknight (5PM-9AM) or Weekend/Holiday: Check [email or web address] (password " [email or web address]") for General Surgery [facility]  coverage  (Please, do not use SecureChat as it is not reliable communication to reach operating surgeons for immediate patient care given surgeries/outpatient duties/clinic/cross-coverage/off post-call which would lead to a delay in care.  Epic staff messaging available for outptient concerns, but may not be answered for 48 hours or more).      04/29/2022

## 2022-04-29 NOTE — Op Note (Signed)
04/29/2022  4:21 PM  PATIENT:  Calvin Brooks  66 y.o. 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  This assistant was needed for exposure, dissection, suction, tissue approximation, retraction, perception, etc.   ANESTHESIA:     General  Regional TRANSVERSUS ABDOMINIS PLANE (TAP) nerve block for perioperative & postoperative pain control provided with liposomal bupivacaine (Experel) mixed with 0.25% bupivacaine as a Bilateral TAP block x 59mL each side at the level of the transverse abdominis & preperitoneal spaces along the flank at the anterior axillary line, from subcostal ridge to iliac crest under laparoscopic guidance   Local field block at port sites & extraction wound  EBL:  Total I/O In: 2550 [I.V.:2300; IV Piggyback:250] Out: 1200 [Urine:150; Other:1000; Blood:50]  Delay start of Pharmacological VTE agent (>24hrs) due to surgical blood loss or risk of bleeding:  no  DRAINS: none   SPECIMEN:  ANASTOMOTIC STRICTURE (ILEUM & RIGHT COLON)  DISPOSITION OF SPECIMEN:  PATHOLOGY  COUNTS:  YES  PLAN OF CARE: Admit to inpatient    PATIENT DISPOSITION:  PACU - hemodynamically stable.  INDICATION:    Patient with known Crohn disease for many decades.  Required resection and takedown of colovesical fistula 1987.  Had resection 1997.  Has not been on various immunosuppression's.  However compliance had tapered off.  Trying to use steroids and has only only to deal with worsening obstructing symptoms with for many years.  CT enterography last year and CT this year confirming chronic stricture in the ileocolonic region at prior anastomosis.  Very dilated bowel.  Strongly recommend by gastrology to undergo repeat resection and transition from steroids to biologic immunosuppression. I recommended segmental resection:  The anatomy & physiology of the digestive tract was discussed.  The pathophysiology was discussed.  Natural history risks without surgery was discussed.   I worked to give an overview of the disease and the frequent need to have multispecialty involvement.  I feel the risks of no intervention will lead to serious problems that outweigh the operative risks; therefore, I recommended a partial colectomy to remove the pathology.  Laparoscopic & open techniques were discussed.   Risks such as bleeding, infection, abscess, leak, reoperation, possible ostomy, hernia, heart attack, death, and other risks were discussed.  I noted a good likelihood this will help address the problem.   Goals of post-operative recovery were discussed as well.  We will work to minimize complications.  An educational handout on the pathology was given as well.  Questions were answered.    The patient expresses understanding & wishes to proceed with surgery.  OR FINDINGS:   Visceral and parietal peritoneum peritoneum coated in dense but soft adhesions.  Distal third of small intestine very dilated with fecalization.  Inflamed stricture at  ileocolonic anastomosis from distal ileum to ascending colon.  Dense interloop adhesions to this region causing  an accordion folds x4 and intermittent closed-loop like obstructions.  Fecalization.  3 cm abscess noted at the epicenter of the anastomotic stricture and small bowel adhesions.  Segment of ileum with partial stricture within 20 cm of ileocolonic anastomotic stricture.  En bloc resection done.  It is an antiperistaltic ileocolonic anastomosis (distal ileum to proximal transverse colon) that rests in the pelvic region.  Patient has most of small intestine remaining, 300 cm.  CASE DATA:  Type of patient?: Elective [facility] Private Case  Status of Case? Elective Scheduled  Infection Present At Time Of Surgery (PATOS)?  ABSCESS    DESCRIPTION:   Informed consent was confirmed.  The patient underwent general anaesthesia without difficulty.  The patient was positioned with arms tucked & secured appropriately.  VTE prevention in place.  The patient's abdomen was clipped, prepped, & draped in a sterile fashion.  Surgical timeout confirmed our plan.  The patient was positioned in reverse Trendelenburg.  Abdominal entry was gained using Varess technique at the left subcostal ridge on the anterior abdominal wall.  No elevated EtCO2 noted.  Port placed.  Camera inspection revealed no injury.  Extra ports were carefully placed under direct laparoscopic visualization.  I did laparoscopic lysed lesion with sharp scissors such that the rest of the ports could be safely placed.  We docked the [employer] Vinci robot carefully and placed intstruments under visualization  Patient had very dense adhesions with a frozen gel mold like abdomen of dense interloop adhesions.  We carefully worked to free off the greater omentum which was somewhat thinned out.  Gradually freed off the small bowel.  Gradually freed off the small bowel loops along the right paracolic gutter to help identify noninflamed colon near the hepatic flexure.  Very dense fibrotic adhesion to the right anterior axillary line of the abdominal wall peritoneum.   Also to the right pelvis.  Carefully freed off loops.  Work to gradually free off small bowel loops out of the pelvis that were adherent to his sigmoid colon.  Had more dense adhesions along his left paracolic gutter as well.  This took several hours.  Annitta Needs off some interloop adhesions to get better mobility towards the midline.  At this point anesthesia concerns of persistent hypercarbia and emphysema despite repositioning and lowered capnoperitoneum..  We had reduced the insufflation pressure and it was still persisting concern.  We therefore decided to complete surgery without the robot.  We undocked the robot.  I made a periumbilical midline incision and wound protector placed.   We were able to eviscerate the small bowel.  We continued lyse adhesions of small bowel interloop's.  We able to eviscerate everything out the short and periumbilical midline wound.  It was quite apparent that he had a very fibrotic old ileocolonic anastomosis.  Very dilated small bowel distal third.  Very dense interloop adhesions folded accordion style.  Once we freed this off it became apparent there was an obvious abscess in the region.  Explains why the small bowel was trying to [facility] soft.  We evacuated and aspirated the abscess.  It sharp dissection to free off small bowel dense adhesions to the inflamed anastomotic stricture.  Worked to meticulously free off all interloop adhesions from the ileocolonic anastomosis to the ligament of Treitz.  Proximal third optically dilated middle third rather dilated and last third of small bowel massively dilated with obvious fecalization.  Colon did not appear inflamed.  We could see some persistent thickening concern for partial stricture about 20 cm proximal to ileal colonic anastomosis in the distal ileum.  We decided proceed with resection.  I created an antiperistaltic side-to-side anastomosis from the ileum to the proximal transverse colon since the mesentery was poor and thinned  out along the remaining ascending colon and hepatic flexure.  Came to a good pedicle coming off the right middle colic artery.  Took the intervening mesentery with the vessel sealer as well as silk suture ligature at the ileal mesenteric base since it was chronically inflamed and thickened to have good hemostasis.  Used a 75 GIA to do 2 firings to have a very long broad antimesenteric anastomosis.  Had healthy bleeding mucosa.  We used suction to aspirate over a liter of fecalized and enteric contents.  That helped decompress the bowel much better.  We then used a TX 90 stapler to staple off the common defect and transect to have a good side-to-side open patent anastomosis.  Did a silk suture at the proximal end "crotch" for the classic antihypertension stitch.  Did silk sutures to imbricate in the corners of the TX staple line using silk mattress suture.  The remaining ileal inflamed mesentery was allowed to fall down more posteriorly.  Because he had very stretched out thin floppy mesentery, I decided to close the ileocolonic mesenteric defect.  We used omentum to help do this to avoid having to involve any ileal mesentery.  Did that medially and laterally to good result with interrupted radial and silk sutures to good results.  Has looked.  Allow the anastomosis to fall in the pelvis.  Since hypercarbia had markedly improved.  No hemodynamic or oxygenation issues.  We did meticulous inspection.  Found a region in the distal jejunum that was somewhat thinned out and did serosal repair using interrupted transverse silk suture x2.  Confirmed patient had 300 cm of small bowel length remaining, consistent with most of his small bowel and colon still remaining.  Allows anastomosis to fall down into the right pelvis saw no other abnormalities.  Because of the very prolonged lysis adhesions with chronic significant obstruction with fecalization, decided to have anesthesia pass nasogastric tube and confirmed the finding  along the greater curvature stomach with the tip in the distal antrum.    Ports & wound protector removed.  Hemostasis was good.  Sterile unused instruments were used from this point.  Patient was reprepped and draped per colon SSI prevention protocol.  Irrigation of several liters and did inspection again to find anastomosis looked healthy, no serosal injury or other concern, no bleeding.  Omentum brought down to help cover and protect the midline wound.   The right suprapubic port fascial defect was closed using #1 PDS on the anterior to's fascia interrupted fashion.  All of the port sites were much smaller narrow so did not do more aggressive closure.  We closed the midline incision using #1 PDS in a running fashion to good result.  Midline wound skin and port sites closed using Monocryl stitch and sterile dressing.  I closed the extraction wound using a 0 Vicryl vertical peritoneal closure and a #1 PDS transverse anterior rectal fascial closure like a small Pfannenstiel closure. I closed the skin with some interrupted Monocryl stitches.  I placed sterile dressings.     Patient is extubated and is stable in PACU recovery room. I discussed postop care with the patient in detail the  office & in the holding area. Instructions are written. I discussed operative findings, updated the patient's status, discussed probable steps to recovery, and gave postoperative recommendations to the patient's brother, Darnel Mchan .  Recommendations were made.  Questions were answered.  He expressed understanding & appreciation.   Adin Hector, M.D., F.A.C.S. Gastrointestinal and Minimally Invasive Surgery [facility] [address] [place], [place] [address] [phone] Main / Paging

## 2022-04-29 NOTE — Progress Notes (Signed)
Patient extubated in PACU.  On nasal cannula.  Patient got agitated and pulled nasogastric tube out.  Initially refusing replacement.  Discussed with PACU nurse at bedside the importance of trying to get it back in since he was quite distended with fecalization and is malnourished.  They will attempt to replace.

## 2022-04-29 NOTE — Progress Notes (Signed)
Patient resting well after surgery, vss, maintaining npo status, mouth care given, per report from previous shift nurse Charlyne Petrin, RN) 3 unsuccessful attempts to replace his ng tube were done in pacu and pacu RN relayed that surgeon said to leave out overnight and attempt again tomorrow morning, both of patients nares have dried blood inside, patient denies any pain other than soreness to his nose, has burped a few times but no flatus yet, surgical dressing and port site dressings intact, foley draining well, will continue to closely monitor.

## 2022-04-29 NOTE — Anesthesia Preprocedure Evaluation (Addendum)
Anesthesia Evaluation  Patient identified by MRN, date of birth, ID band Patient awake    Reviewed: Allergy & Precautions, NPO status , Patient's Chart, lab work & pertinent test results  History of Anesthesia Complications Negative for: history of anesthetic complications  Airway Mallampati: III  TM Distance: >3 FB Neck ROM: Full    Dental  (+) Caps, Dental Advisory Given   Pulmonary Current Smoker and Patient abstained from smoking.,    breath sounds clear to auscultation       Cardiovascular negative cardio ROS   Rhythm:Regular Rate:Normal     Neuro/Psych Syncope on Sunday, work-up in ED only revealed dehydration and ECG with delta wave/WPW pattern    GI/Hepatic Neg liver ROS, GERD  Controlled,Crohn's   Endo/Other  negative endocrine ROS  Renal/GU negative Renal ROS     Musculoskeletal   Abdominal   Peds  Hematology negative hematology ROS (+)   Anesthesia Other Findings   Reproductive/Obstetrics                           Anesthesia Physical Anesthesia Plan  ASA: 3  Anesthesia Plan: General   Post-op Pain Management: Tylenol PO (pre-op)*   Induction: Intravenous  PONV Risk Score and Plan: 1 and Ondansetron and Dexamethasone  Airway Management Planned: Oral ETT and Video Laryngoscope Planned  Additional Equipment: None  Intra-op Plan:   Post-operative Plan: Extubation in OR  Informed Consent: I have reviewed the patients History and Physical, chart, labs and discussed the procedure including the risks, benefits and alternatives for the proposed anesthesia with the patient or authorized representative who has indicated his/her understanding and acceptance.     Dental advisory given  Plan Discussed with: CRNA and Surgeon  Anesthesia Plan Comments:        Anesthesia Quick Evaluation

## 2022-04-30 ENCOUNTER — Other Ambulatory Visit (HOSPITAL_COMMUNITY): Payer: Self-pay

## 2022-04-30 LAB — MAGNESIUM: Magnesium: 2.1 mg/dL (ref 1.7–2.4)

## 2022-04-30 LAB — BASIC METABOLIC PANEL
Anion gap: 8 (ref 5–15)
BUN: 20 mg/dL (ref 8–23)
CO2: 22 mmol/L (ref 22–32)
Calcium: 7.7 mg/dL — ABNORMAL LOW (ref 8.9–10.3)
Chloride: 109 mmol/L (ref 98–111)
Creatinine, Ser: 0.84 mg/dL (ref 0.61–1.24)
GFR, Estimated: >60 mL/min (ref >60)
Glucose, Bld: 83 mg/dL (ref 70–99)
Potassium: 3.7 mmol/L (ref 3.5–5.1)
Sodium: 139 mmol/L (ref 135–145)

## 2022-04-30 LAB — CBC
HCT: 34.5 % — ABNORMAL LOW (ref 39.0–52.0)
Hemoglobin: 11.3 g/dL — ABNORMAL LOW (ref 13.0–17.0)
MCH: 30.9 pg (ref 26.0–34.0)
MCHC: 32.8 g/dL (ref 30.0–36.0)
MCV: 94.3 fL (ref 80.0–100.0)
Platelets: 310 10*3/uL (ref 150–400)
RBC: 3.66 MIL/uL — ABNORMAL LOW (ref 4.22–5.81)
RDW: 15.8 % — ABNORMAL HIGH (ref 11.5–15.5)
WBC: 19.2 10*3/uL — ABNORMAL HIGH (ref 4.0–10.5)
nRBC: 0 % (ref 0.0–0.2)

## 2022-04-30 LAB — PREALBUMIN: Prealbumin: 9 mg/dL — ABNORMAL LOW (ref 18–38)

## 2022-04-30 MED ORDER — SIMETHICONE 80 MG PO CHEW
40.0000 mg | CHEWABLE_TABLET | Freq: Four times a day (QID) | ORAL | Status: AC
  Administered 2022-04-30 – 2022-05-01 (×7): 40 mg via ORAL
  Filled 2022-04-30 (×7): qty 1

## 2022-04-30 MED ORDER — SODIUM CHLORIDE 0.9 % IV SOLN
1.0000 g | INTRAVENOUS | Status: AC
  Administered 2022-05-01 – 2022-05-02 (×2): 1000 mg via INTRAVENOUS
  Administered 2022-05-03: 1 g via INTRAVENOUS
  Filled 2022-04-30 (×4): qty 1

## 2022-04-30 NOTE — Progress Notes (Signed)
Initial Nutrition Assessment  DOCUMENTATION CODES:   Non-severe (moderate) malnutrition in context of chronic illness  INTERVENTION:   Once diet advanced: -resume Ensure Surgery PO BID, each provides 330 kcals and 18g protein   -Will monitor for plan and diet advancement  NUTRITION DIAGNOSIS:   Moderate Malnutrition related to chronic illness (Crohn's disease) as evidenced by mild muscle depletion, moderate fat depletion, energy intake < or equal to 75% for > or equal to 1 month.  GOAL:   Patient will meet greater than or equal to 90% of their needs  MONITOR:   PO intake, Supplement acceptance, Labs, Weight trends, I & O's  REASON FOR ASSESSMENT:   Malnutrition Screening Tool    ASSESSMENT:   66 year old male came to ED with syncope.  Patient has history of Crohn's.  He has been having ongoing issues and is scheduled for a outpatient colostomy.  Secondary to this, he has been on a liquid diet for the past 2 weeks.  10/18: s/p colectomy, LOA, drainage of abscess  Patient states he has been on a clear liquid diet off and on for about 6 weeks PTA. Over the last 10 days PTA he was trying to incorporate more solids in his diet like chicken and cheeses.  Per surgery, at high risk of ileus. If pt unable to have diet advanced, may need TPN.  Pt reports ~40 lbs of weight loss. UBW is ~230 lbs. Unable to verify time frame, suspect significant.  Medications: Vitamin D, Vitamin B-12, Ferrous sulfate, Multivitamin with minerals daily, Fibercon  Labs reviewed.  NUTRITION - FOCUSED PHYSICAL EXAM:  Flowsheet Row Most Recent Value  Orbital Region Moderate depletion  Upper Arm Region Moderate depletion  Thoracic and Lumbar Region Unable to assess  [pain]  Buccal Region Mild depletion  Temple Region Mild depletion  Clavicle Bone Region Moderate depletion  Clavicle and Acromion Bone Region Mild depletion  Scapular Bone Region Mild depletion  Dorsal Hand Mild depletion  Patellar  Region Unable to assess  Anterior Thigh Region Unable to assess  Posterior Calf Region Unable to assess  Edema (RD Assessment) None  Hair Reviewed  Eyes Reviewed  Mouth Reviewed  Skin Reviewed       Diet Order:   Diet Order             Diet NPO time specified Except for: Ice Chips  Diet effective now           Diet - low sodium heart healthy                   EDUCATION NEEDS:   No education needs have been identified at this time  Skin:  Skin Assessment: Skin Integrity Issues: Skin Integrity Issues:: Incisions Incisions: 10/18 abdomen  Last BM:  10/17  Height:   Ht Readings from Last 1 Encounters:  04/29/22 6\' 4"  (1.93 m)    Weight:   Wt Readings from Last 1 Encounters:  04/29/22 87.9 kg    BMI:  Body mass index is 23.59 kg/m.  Estimated Nutritional Needs:   Kcal:  2150-2350  Protein:  105-120g  Fluid:  2.1L/day  Clayton Bibles, MS, RD, LDN Inpatient Clinical Dietitian Contact information available via Amion

## 2022-04-30 NOTE — TOC Progression Note (Signed)
Transition of Care [facility]) - Progression Note    Patient Details  Name: Calvin Brooks MRN: [id number] Date of Birth: [birth year]  Transition of Care [facility]) CM/SW Contact  Henrietta Dine, RN Phone Number: 04/30/2022, 9:45 AM  Clinical Narrative:     Transition of Care [facility]) Screening Note   Patient Details  Name: [name] Date of Birth: 1955/11/19   Transition of Care [facility]) CM/SW Contact:    Henrietta Dine, RN Phone Number: 04/30/2022, 9:45 AM    Transition of Care Department [facility]) has reviewed patient and no TOC needs have been identified at this time. We will continue to monitor patient advancement through interdisciplinary progression rounds. If new patient transition needs arise, please place a TOC consult.          Expected Discharge Plan and Services                                                 Social Determinants of Health (SDOH) Interventions    Readmission Risk Interventions     No data to display

## 2022-04-30 NOTE — Consult Note (Signed)
[facility] Nurse ostomy consult note Pt did not receive an ostomy yesterday during surgery.  No further role for WOC team. Please re-consult if further assistance is needed.  Thank-you,  Julien Girt MSN, [place], [place], [place], [place]

## 2022-04-30 NOTE — Progress Notes (Signed)
Calvin Brooks [id number] [birth year]  CARE TEAM:  PCP: Calvin Frames, MD  Outpatient Care Team: Patient Care Team: Calvin Frames, MD as PCP - General (Internal Medicine) Calvin Pop, MD as Referring Physician (Colon and Rectal Surgery) Calvin Corner, MD as Consulting Physician (Gastroenterology) Calvin Boston, MD as Consulting Physician (General Surgery)  Inpatient Treatment Team: Treatment Team: Attending Provider: Michael Boston, MD; [place] Nurse: Calvin Mode, RN; Registered Nurse: Calvin Mess, RN   Problem List:   Principal Problem:   Crohn's disease of ileum with intestinal obstruction (HCC) Active Problems:   GERD (gastroesophageal reflux disease)   Protein-calorie malnutrition, moderate ([place])   1 Day Post-Op  04/29/2022  POST-OPERATIVE DIAGNOSIS:    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  Distal third of small intestine very dilated with fecalization.  Inflamed stricture at ileocolonic anastomosis from distal ileum to ascending colon.  Dense interloop adhesions to this region causing an accordion folds x4 and intermittent closed-loop like obstructions.  Fecalization.  3 cm abscess noted at the epicenter of the anastomotic stricture and small bowel adhesions.  Segment of ileum with partial stricture within 20 cm of ileocolonic anastomotic stricture.  En bloc resection done.   It is an antiperistaltic ileocolonic anastomosis (distal ileum to proximal transverse colon) that rests in the pelvic region.  Patient has most of small intestine remaining,  300 cm.   CASE DATA:   Type of patient?: Elective [facility] Private Case   Status of Case? Elective Scheduled   Infection Present At Time Of Surgery (PATOS)?  ABSCESS  Assessment  Stable.  [facility] Stay = 1 days)  Plan:  ERAS protocol.  Keep on sips for now.  Patient very high risk for postoperative ileus given extensive lysis of adhesions for very dense adhesions.  I had nasogastric tube in place but he pulled it out in confusion (does not recall event).  Nursing was unable to get it safely to pass despite multiple bedside attempts. We will try and hold off.  If he feels more nauseated, may ask fluoroscopy to place NGT tomorrow.  With interoperative abscess found, continue antibiotics for 5 days total to minimize risk of recurrence since he is malnourished and immunosuppressed  Suspect he is moderately malnourished.  If his ileus resolves quickly will advance diet as tolerated.  If persistent to the weekend, start plan and TPN  Follow-up on pathology  -VTE prophylaxis- SCDs, etc  -mobilize as tolerated to help recovery  Disposition:  Disposition:  The patient is from: Home  Anticipate discharge to:  Home  Anticipated Date of Discharge is:  October 22,2023    Barriers to discharge:  Pending Clinical improvement (more likely than not)  Patient currently is NOT MEDICALLY STABLE for discharge from the hospital from a surgery standpoint.      I reviewed nursing notes, last 24 h vitals and pain scores, last 48 h intake and output, last 24 h labs and trends, and last 24 h imaging results. I have reviewed this patient's available data, including medical history, events of note, test results, etc as part of my evaluation.  A significant  portion of that time was spent in counseling.  Care during the described time interval was provided by me.  This care required moderate level of medical decision making.  04/30/2022    Subjective: (Chief complaint)  Patient confused and  post nasogastric tube in recovery.  Nursing unable to get one back in.  Patient denies much abdominal pain.  Feeling a little bloated.  No nausea.  Nursing just outside room.  Objective:  Vital signs:  Vitals:   04/29/22 2200 04/30/22 0136 04/30/22 0137 04/30/22 0502  BP: (!) 149/69 (!) 145/62 (!) 145/62 (!) 145/69  Pulse: 76 87 86 81  Resp: 18 18 18 18   Temp: 98.4 F (36.9 C) 98 F (36.7 C) 98 F (36.7 C) 98.5 F (36.9 C)  TempSrc: Oral Oral Oral Oral  SpO2: 96% 95% 95% 98%  Weight:      Height:        Last BM Date : 04/28/22  Intake/Output   Yesterday:  10/18 0701 - 10/19 0700 In: 3918.9 [P.O.:30; I.V.:3538.9; IV Piggyback:350] Out: 2375 [Urine:1275; Emesis/NG output:50; Blood:50] This shift:  Total I/O In: 131.5 [P.O.:30; I.V.:101.5] Out: 1050 [Urine:1050]  Bowel function:  Flatus: No  BM:  No  Drain: (No drain)   Physical Exam:  General: Pt awake/alert in no acute distress Eyes: PERRL, normal EOM.  Sclera clear.  No icterus Neuro: CN II-XII intact w/o focal sensory/motor deficits. Lymph: No head/neck/groin lymphadenopathy Psych:  No delerium/psychosis/paranoia.  Oriented x 4 HENT: Normocephalic, Mucus membranes moist.  No thrush Neck: Supple, No tracheal deviation.  No obvious thyromegaly Chest: No pain to chest wall compression.  Good respiratory excursion.  No audible wheezing CV:  Pulses intact.  Regular rhythm.  No major extremity edema MS: Normal AROM mjr joints.  No obvious deformity  Abdomen: Soft.  Mildy distended.  Mildly tender at incisions only.  Dressings c/d/i.  No evidence of peritonitis.  No incarcerated hernias.  Ext:   No deformity.  No mjr edema.  No cyanosis Skin: No petechiae / purpurea.  No major sores.  Warm and dry    Results:   Cultures: Recent Results (from the past 720 hour(s))  SARS Coronavirus 2 by RT PCR (hospital order, performed in [facility] hospital lab) *cepheid single result test* Anterior Nasal Swab      Status: None   Collection Time: 04/26/22 10:02 AM   Specimen: Anterior Nasal Swab  Result Value Ref Range Status   SARS Coronavirus 2 by RT PCR NEGATIVE NEGATIVE Final    Comment: (NOTE) SARS-CoV-2 target nucleic acids are NOT DETECTED.  The SARS-CoV-2 RNA is generally detectable in upper and lower respiratory specimens during the acute phase of infection. The lowest concentration of SARS-CoV-2 viral copies this assay can detect is 250 copies / mL. A negative result does not preclude SARS-CoV-2 infection and should not be used as the sole basis for treatment or other patient management decisions.  A negative result may occur with improper specimen collection / handling, submission of specimen other than nasopharyngeal swab, presence of viral mutation(s) within the areas targeted by this assay, and inadequate number of viral copies (<250 copies / mL). A negative result must be combined with clinical observations, patient history, and epidemiological information.  Fact Sheet for Patients:   https://www.patel.info/  Fact Sheet for Healthcare Providers: https://hall.com/  This test is not yet approved or  cleared by the [place] FDA and has been authorized for detection and/or diagnosis of SARS-CoV-2 by FDA under an Emergency  Use Authorization (EUA).  This EUA will remain in effect (meaning this test can be used) for the duration of the COVID-19 declaration under Section 564(b)(1) of the Act, 21 U.S.C. section 360bbb-3(b)(1), unless the authorization is terminated or revoked sooner.  Performed at [facility], [place] [address], [place], [place] [address]     Labs: Results for orders placed or performed during the hospital encounter of 04/29/22 (from the past 48 hour(s))  Basic metabolic panel     Status: Abnormal   Collection Time: 04/30/22  4:28 AM  Result Value Ref Range   Sodium 139 135 - 145 mmol/L   Potassium 3.7 3.5 -  5.1 mmol/L   Chloride 109 98 - 111 mmol/L   CO2 22 22 - 32 mmol/L   Glucose, Bld 83 70 - 99 mg/dL    Comment: Glucose reference range applies only to samples taken after fasting for at least 8 hours.   BUN 20 8 - 23 mg/dL   Creatinine, Ser 0.84 0.61 - 1.24 mg/dL   Calcium 7.7 (L) 8.9 - 10.3 mg/dL   GFR, Estimated >60 >60 mL/min    Comment: (NOTE) Calculated using the CKD-EPI Creatinine Equation (2021)    Anion gap 8 5 - 15    Comment: Performed at [facility], [place] [address], [place], [place] [address]  CBC     Status: Abnormal   Collection Time: 04/30/22  4:28 AM  Result Value Ref Range   WBC 19.2 (H) 4.0 - 10.5 K/uL   RBC 3.66 (L) 4.22 - 5.81 MIL/uL   Hemoglobin 11.3 (L) 13.0 - 17.0 g/dL   HCT 34.5 (L) 39.0 - 52.0 %   MCV 94.3 80.0 - 100.0 fL   MCH 30.9 26.0 - 34.0 pg   MCHC 32.8 30.0 - 36.0 g/dL   RDW 15.8 (H) 11.5 - 15.5 %   Platelets 310 150 - 400 K/uL   nRBC 0.0 0.0 - 0.2 %    Comment: Performed at [facility], [place] [address], [place], [place] [address]  Magnesium     Status: None   Collection Time: 04/30/22  4:28 AM  Result Value Ref Range   Magnesium 2.1 1.7 - 2.4 mg/dL    Comment: Performed at [facility], [place] [address], [place], [place] [address]    Imaging / Studies: DG Abd 1 View  Result Date: 04/29/2022 CLINICAL DATA:  NG tube placement.  Postoperative bowel surgery. EXAM: ABDOMEN - 1 VIEW COMPARISON:  12/10/2021 FINDINGS: Limited radiograph of the lower chest and upper abdomen was obtained for the purposes of enteric tube localization. No enteric tube is seen within the field of view. Small-moderate volume pneumoperitoneum is present as well as abdominal wall subcutaneous emphysema. IMPRESSION: 1. No enteric tube is seen within the field of view. 2. Small-moderate volume pneumoperitoneum and abdominal wall subcutaneous emphysema. Findings are presumably related to history of bowel surgery. Electronically  Signed   By: Davina Poke D.O.   On: 04/29/2022 18:28    Medications / Allergies: per chart  Antibiotics: Anti-infectives (From admission, onward)    Start     Dose/Rate Route Frequency Ordered Stop   04/29/22 1030  ertapenem (INVANZ) 1,000 mg in sodium chloride 0.9 % 100 mL IVPB        1 g 200 mL/hr over 30 Minutes Intravenous On call to O.R. 04/29/22 1022 04/29/22 1225         Note: Portions of this report may  have been transcribed using voice recognition software. Every effort was made to ensure accuracy; however, inadvertent computerized transcription errors may be present.   Any transcriptional errors that result from this process are unintentional.    Calvin Hector, MD, FACS, MASCRS Esophageal, Gastrointestinal & Colorectal Surgery Robotic and Minimally Invasive Surgery  [place]. [address] [place], [place] [address] [phone] Fax [phone] Main  CONTACT INFORMATION:  Weekday (9AM-5PM): Call [facility] main office at [phone]  Weeknight (5PM-9AM) or Weekend/Holiday: Check [email or web address] (password " [email or web address]") for General Surgery [facility] coverage  (Please, do not use SecureChat as it is not reliable communication to reach operating surgeons for immediate patient care given surgeries/outpatient duties/clinic/cross-coverage/off post-call which would lead to a delay in care.  Epic staff messaging available for outptient concerns, but may not be answered for 48 hours or more).     04/30/2022  5:55 AM

## 2022-05-01 DIAGNOSIS — K8020 Calculus of gallbladder without cholecystitis without obstruction: Secondary | ICD-10-CM

## 2022-05-01 DIAGNOSIS — R918 Other nonspecific abnormal finding of lung field: Secondary | ICD-10-CM

## 2022-05-01 DIAGNOSIS — I739 Peripheral vascular disease, unspecified: Secondary | ICD-10-CM

## 2022-05-01 LAB — SURGICAL PATHOLOGY

## 2022-05-01 MED ORDER — METHOCARBAMOL 500 MG PO TABS
500.0000 mg | ORAL_TABLET | Freq: Three times a day (TID) | ORAL | Status: AC
  Administered 2022-05-01 – 2022-05-03 (×9): 500 mg via ORAL
  Filled 2022-05-01 (×9): qty 1

## 2022-05-01 NOTE — Progress Notes (Signed)
Mobility Specialist - Progress Note   05/01/22 1105  Mobility  Activity Ambulated with assistance in hallway  Level of Assistance Standby assist, set-up cues, supervision of patient - no hands on  Assistive Device Front wheel walker  Distance Ambulated (ft) 500 ft  Activity Response Tolerated well  Mobility Referral Yes  $Mobility charge 1 Mobility   Pt received in bed and agreeable to mobility. No complaints during mobility. Pt to bed after session with all needs met.     [facility]

## 2022-05-01 NOTE — Progress Notes (Signed)
PT Cancellation Note  Patient Details Name: Calvin Brooks MRN: [id number] DOB: 05/13/1956   Cancelled Treatment:    Reason Eval/Treat Not Completed: Patient declined, no reason specified. Pt verbalizes frustrations with multiple people in/out of room, reports already ambulating with someone this morning and unable to encourage pt to participate with PT eval. Will continue efforts.    Talbot Grumbling PT, DPT 05/01/22, 4:24 PM

## 2022-05-01 NOTE — Progress Notes (Signed)
SUHAN PACI [id number] 1955/12/20  CARE TEAM:  PCP: Emilio Aspen, MD  Outpatient Care Team: Patient Care Team: Emilio Aspen, MD as PCP - General (Internal Medicine) Claretta Fraise, MD as Referring Physician (Colon and Rectal Surgery) Charlott Rakes, MD as Consulting Physician (Gastroenterology) Karie Soda, MD as Consulting Physician (General Surgery)  Inpatient Treatment Team: Treatment Team: Attending Provider: Karie Soda, MD; Technician: Vella Raring, NT; Pharmacist: Lucia Gaskins, 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  Distal third of small intestine very dilated with fecalization.  Inflamed stricture at ileocolonic anastomosis from distal ileum to ascending colon.  Dense interloop adhesions to this region causing an accordion folds x4 and intermittent closed-loop like obstructions.  Fecalization.  3 cm abscess noted at the epicenter of the anastomotic stricture and small bowel adhesions.  Segment of ileum with partial stricture within 20 cm of ileocolonic anastomotic stricture.  En bloc resection done.   It is an antiperistaltic ileocolonic anastomosis (distal  ileum to proximal transverse colon) that rests in the pelvic region.  Patient has most of small intestine remaining, 300 cm.   CASE DATA:   Type of patient?: Elective [facility] Private Case   Status of Case? Elective Scheduled   Infection Present At Time Of Surgery (PATOS)?  ABSCESS  Assessment  Stabilizing.  High risk of ileus but no decline.  [facility] Stay = 2 days)  Plan:  ERAS protocol.  Try clear liquids.  See if we can slowly advance as long as he is not nauseated.  Supplemental shakes.  Patient very high risk for postoperative ileus given extensive lysis of adhesions for very dense adhesions.  I had nasogastric tube in place but he pulled it out in confusion (does not recall event).  If he has worsening distention discomfort, we will have to order fluoroscopy to place nasogastric tube.  Try and hold off today.  Chronic iron deficiency anemia and anemia of chronic disease.  Hemoglobin not severely low.  Given his bloating, hold off on PO iron.  Follow.  With interoperative abscess found, continue antibiotics for 5 days total to minimize risk of recurrence since he is malnourished and immunosuppressed  Suspect he is moderately malnourished.  If his ileus resolves quickly will advance diet as tolerated.  If persistent to the weekend, start plan and TPN  Follow-up on pathology  -VTE prophylaxis- SCDs, etc  -mobilize as tolerated to help recovery.  Given his somewhat deconditioned state and challenging historian, ask therapies to objectively evaluate and make sure that we are not missing anything.  Disposition:  Disposition:  The patient is from: Home  Anticipate discharge to:  Home  Anticipated Date of Discharge is:  October 24,2023    Barriers to discharge:  Pending Clinical improvement (more likely than not)  Patient currently is NOT MEDICALLY STABLE for discharge from the hospital from a surgery standpoint.      I reviewed nursing notes, last 24 h vitals and pain  scores, last 48 h intake and output, last 24 h labs and trends, and last 24 h imaging results. I have reviewed this patient's available data, including medical history, events of note, test results, etc as part of my evaluation.  A significant portion of that time was spent in counseling.  Care during the described time interval was provided by me.  This care required moderate level of medical decision making.  05/01/2022    Subjective: (Chief complaint)  Patient with soreness.  Does not know what meds he is getting.  Did not ask for any pain meds.  Sat got up in chair but did not feel like walking.  Not nauseated or bloating.  Objective:  Vital signs:  Vitals:   04/30/22 1854 04/30/22 2009 05/01/22 0500 05/01/22 0535  BP: 137/62 138/65  (!) 141/65  Pulse: 79 77  80  Resp: 17 18  18   Temp: 98.8 F (37.1 C) 98.8 F (37.1 C)  98.5 F (36.9 C)  TempSrc: Oral Oral  Oral  SpO2: 96% 97%  96%  Weight:   90.9 kg   Height:        Last BM Date : 04/28/22  Intake/Output   Yesterday:  10/19 0701 - 10/20 0700 In: 14.5 [I.V.:8.9; IV Piggyback:5.6] Out: 550 [Urine:550] This shift:  No intake/output data recorded.  Bowel function:  Flatus: No  BM:  No  Drain: (No drain)   Physical Exam:  General: Pt awake/alert in no acute distress.  Tired.  Not toxic. Eyes: PERRL, normal EOM.  Sclera clear.  No icterus Neuro: CN II-XII intact w/o focal sensory/motor deficits. Lymph: No head/neck/groin lymphadenopathy Psych:  No delerium/psychosis/paranoia.  Oriented x 4 HENT: Normocephalic, Mucus membranes moist.  No thrush Neck: Supple, No tracheal deviation.  No obvious thyromegaly Chest: No pain to chest wall compression.  Good respiratory excursion.  No audible wheezing CV:  Pulses intact.  Regular rhythm.  No major extremity edema MS: Normal AROM mjr joints.  No obvious deformity  Abdomen: Soft.  Nondistended.  Mildly tender at incisions only.  Dressings c/d/i.  No evidence of  peritonitis.  No incarcerated hernias.  Ext:   No deformity.  No mjr edema.  No cyanosis Skin: No petechiae / purpurea.  No major sores.  Warm and dry    Results:   Cultures: Recent Results (from the past 720 hour(s))  SARS Coronavirus 2 by RT PCR (hospital order, performed in [facility] hospital lab) *cepheid single result test* Anterior Nasal Swab     Status: None   Collection Time: 04/26/22 10:02 AM   Specimen: Anterior Nasal Swab  Result Value Ref Range Status   SARS Coronavirus 2 by RT PCR NEGATIVE NEGATIVE Final    Comment: (NOTE) SARS-CoV-2 target nucleic acids are NOT DETECTED.  The SARS-CoV-2 RNA is generally detectable in upper and lower respiratory specimens during the acute phase of infection. The lowest concentration of SARS-CoV-2 viral copies this assay can detect is 250 copies / mL. A negative result does not preclude SARS-CoV-2 infection and should not be used as the sole basis for treatment or other patient management decisions.  A negative result may occur with improper specimen collection / handling, submission of specimen other than nasopharyngeal swab, presence of viral mutation(s) within the areas  targeted by this assay, and inadequate number of viral copies (<250 copies / mL). A negative result must be combined with clinical observations, patient history, and epidemiological information.  Fact Sheet for Patients:   RoadLapTop.co.za  Fact Sheet for Healthcare Providers: http://kim-miller.com/  This test is not yet approved or  cleared by the [place] FDA and has been authorized for detection and/or diagnosis of SARS-CoV-2 by FDA under an Emergency Use Authorization (EUA).  This EUA will remain in effect (meaning this test can be used) for the duration of the COVID-19 declaration under Section 564(b)(1) of the Act, 21 U.S.C. section 360bbb-3(b)(1), unless the authorization is terminated or revoked  sooner.  Performed at [facility], [address], [place], [place] [address]     Labs: Results for orders placed or performed during the hospital encounter of 04/29/22 (from the past 48 hour(s))  Prealbumin     Status: Abnormal   Collection Time: 04/30/22  4:28 AM  Result Value Ref Range   Prealbumin 9 (L) 18 - 38 mg/dL    Comment: Performed at [facility], [address], [place], [place] [address]  Basic metabolic panel     Status: Abnormal   Collection Time: 04/30/22  4:28 AM  Result Value Ref Range   Sodium 139 135 - 145 mmol/L   Potassium 3.7 3.5 - 5.1 mmol/L   Chloride 109 98 - 111 mmol/L   CO2 22 22 - 32 mmol/L   Glucose, Bld 83 70 - 99 mg/dL    Comment: Glucose reference range applies only to samples taken after fasting for at least 8 hours.   BUN 20 8 - 23 mg/dL   Creatinine, Ser 6.60 0.61 - 1.24 mg/dL   Calcium 7.7 (L) 8.9 - 10.3 mg/dL   GFR, Estimated >63 >01 mL/min    Comment: (NOTE) Calculated using the CKD-EPI Creatinine Equation (2021)    Anion gap 8 5 - 15    Comment: Performed at [facility], [address], [place], [place] [address]  CBC     Status: Abnormal   Collection Time: 04/30/22  4:28 AM  Result Value Ref Range   WBC 19.2 (H) 4.0 - 10.5 K/uL   RBC 3.66 (L) 4.22 - 5.81 MIL/uL   Hemoglobin 11.3 (L) 13.0 - 17.0 g/dL   HCT 32.3 (L) 55.7 - 32.2 %   MCV 94.3 80.0 - 100.0 fL   MCH 30.9 26.0 - 34.0 pg   MCHC 32.8 30.0 - 36.0 g/dL   RDW 02.5 (H) 42.7 - 06.2 %   Platelets 310 150 - 400 K/uL   nRBC 0.0 0.0 - 0.2 %    Comment: Performed at [facility], [address], [place], [place] [address]  Magnesium     Status: None   Collection Time: 04/30/22  4:28 AM  Result Value Ref Range   Magnesium 2.1 1.7 - 2.4 mg/dL    Comment: Performed at [facility], [address] [place], [place] [address]    Imaging / Studies: DG Abd 1 View  Result Date: 04/29/2022 CLINICAL DATA:  NG  tube placement.  Postoperative bowel surgery. EXAM: ABDOMEN - 1 VIEW COMPARISON:  12/10/2021 FINDINGS: Limited radiograph of the lower chest and upper abdomen was obtained for the purposes of enteric tube localization. No enteric tube is seen within the field of view. Small-moderate volume pneumoperitoneum is present as well as abdominal wall subcutaneous emphysema. IMPRESSION: 1. No enteric tube  is seen within the field of view. 2. Small-moderate volume pneumoperitoneum and abdominal wall subcutaneous emphysema. Findings are presumably related to history of bowel surgery. Electronically Signed   By: Duanne Guess D.O.   On: 04/29/2022 18:28    Medications / Allergies: per chart  Antibiotics: Anti-infectives (From admission, onward)    Start     Dose/Rate Route Frequency Ordered Stop   04/30/22 0645  ertapenem (INVANZ) 1,000 mg in sodium chloride 0.9 % 100 mL IVPB        1 g 200 mL/hr over 30 Minutes Intravenous Every 24 hours 04/30/22 0556 05/04/22 0644   04/29/22 1030  ertapenem (INVANZ) 1,000 mg in sodium chloride 0.9 % 100 mL IVPB        1 g 200 mL/hr over 30 Minutes Intravenous On call to O.R. 04/29/22 1022 04/29/22 1225         Note: Portions of this report may have been transcribed using voice recognition software. Every effort was made to ensure accuracy; however, inadvertent computerized transcription errors may be present.   Any transcriptional errors that result from this process are unintentional.    Ardeth Sportsman, MD, FACS, MASCRS Esophageal, Gastrointestinal & Colorectal Surgery Robotic and Minimally Invasive Surgery  [facility] [address] [place], [place] [address] [phone] Fax [phone] Main  CONTACT INFORMATION:  Weekday (9AM-5PM): Call [facility] main office at [phone]  Weeknight (5PM-9AM) or Weekend/Holiday: Check [email or web address] (password " [email or web address]") for General Surgery [facility]  coverage  (Please, do not use SecureChat as it is not reliable communication to reach operating surgeons for immediate patient care given surgeries/outpatient duties/clinic/cross-coverage/off post-call which would lead to a delay in care.  Epic staff messaging available for outptient concerns, but may not be answered for 48 hours or more).     05/01/2022  7:53 AM

## 2022-05-02 LAB — CBC
HCT: 31.3 % — ABNORMAL LOW (ref 39.0–52.0)
Hemoglobin: 10.2 g/dL — ABNORMAL LOW (ref 13.0–17.0)
MCH: 30.7 pg (ref 26.0–34.0)
MCHC: 32.6 g/dL (ref 30.0–36.0)
MCV: 94.3 fL (ref 80.0–100.0)
Platelets: 258 10*3/uL (ref 150–400)
RBC: 3.32 MIL/uL — ABNORMAL LOW (ref 4.22–5.81)
RDW: 16 % — ABNORMAL HIGH (ref 11.5–15.5)
WBC: 10.6 10*3/uL — ABNORMAL HIGH (ref 4.0–10.5)
nRBC: 0 % (ref 0.0–0.2)

## 2022-05-02 LAB — CREATININE, SERUM
Creatinine, Ser: 0.7 mg/dL (ref 0.61–1.24)
GFR, Estimated: >60 mL/min (ref >60)

## 2022-05-02 LAB — POTASSIUM: Potassium: 3.8 mmol/L (ref 3.5–5.1)

## 2022-05-02 NOTE — Progress Notes (Signed)
3 Days Post-Op   Subjective/Chief Complaint: Doing well today Some abd soreness Tol clears today No bowel fxn   Objective: Vital signs in last 24 hours: Temp:  [97.8 F (36.6 C)-98.4 F (36.9 C)] 98.4 F (36.9 C) (10/21 0607) Pulse Rate:  [72-78] 72 (10/21 0607) Resp:  [14-18] 14 (10/21 0607) BP: (119-144)/(63-83) 144/83 (10/21 0607) SpO2:  [93 %-95 %] 95 % (10/21 0607) Weight:  [92.9 kg] 92.9 kg (10/21 0500) Last BM Date : 04/28/22  Intake/Output from previous day: 10/20 0701 - 10/21 0700 In: 94.6 [IV Piggyback:94.6] Out: 675 [Urine:675] Intake/Output this shift: No intake/output data recorded.  PE:  Constitutional: No acute distress, conversant, appears states age. Eyes: Anicteric sclerae, moist conjunctiva, no lid lag Lungs: Clear to auscultation bilaterally, normal respiratory effort CV: regular rate and rhythm, no murmurs, no peripheral edema, pedal pulses 2+ GI: Soft, no masses or hepatosplenomegaly, non-tender to palpation, hypoactive BS, inc c/d/i Skin: No rashes, palpation reveals normal turgor Psychiatric: appropriate judgment and insight, oriented to person, place, and time   Lab Results:  Recent Labs    04/30/22 0428 05/02/22 0036  WBC 19.2* 10.6*  HGB 11.3* 10.2*  HCT 34.5* 31.3*  PLT 310 258   BMET Recent Labs    04/30/22 0428 05/02/22 0036  NA 139  --   K 3.7 3.8  CL 109  --   CO2 22  --   GLUCOSE 83  --   BUN 20  --   CREATININE 0.84 0.70  CALCIUM 7.7*  --    PT/INR No results for input(s): "LABPROT", "INR" in the last 72 hours. ABG No results for input(s): "PHART", "HCO3" in the last 72 hours.  Invalid input(s): "PCO2", "PO2"  Studies/Results: No results found.  Anti-infectives: Anti-infectives (From admission, onward)    Start     Dose/Rate Route Frequency Ordered Stop   04/30/22 0645  ertapenem (INVANZ) 1,000 mg in sodium chloride 0.9 % 100 mL IVPB        1 g 200 mL/hr over 30 Minutes Intravenous Every 24 hours  04/30/22 0556 05/04/22 0644   04/29/22 1030  ertapenem (INVANZ) 1,000 mg in sodium chloride 0.9 % 100 mL IVPB        1 g 200 mL/hr over 30 Minutes Intravenous On call to O.R. 04/29/22 1022 04/29/22 1225       Assessment/Plan: s/p Procedure(s): ROBOTIC ILEOCECECTOMY OF CROHNS STRICTURES AND LYSIS OF ADHESIONS (N/A) -adv to full liq diet, going slow -encouaged to mobilze -await bowel fxn -Abx x 5 d  LOS: 3 days    Ralene Ok 05/02/2022

## 2022-05-02 NOTE — Plan of Care (Signed)

## 2022-05-02 NOTE — Evaluation (Signed)
Occupational Therapy Evaluation Patient Details Name: Calvin Brooks MRN: [id number] DOB: [birth year] Today's Date: 05/02/2022   History of Present Illness 67 y.o. male admitted for colectomy and LOA on 04/29/22. PMH: Crohn's, PVD, HTN, lung nodules.   Clinical Impression   Pt admitted with the abve. Pt currently with functional limitations due to the deficits listed below (see OT Problem List).  Pt will benefit from skilled OT to increase their safety and independence with ADL and functional mobility for ADL to facilitate discharge to venue listed below.   Pt states brother will come and Assist.  OT does feel pt will need A PRN A at home with ADL activity and home management.        Recommendations for follow up therapy are one component of a multi-disciplinary discharge planning process, led by the attending physician.  Recommendations may be updated based on patient status, additional functional criteria and insurance authorization.   Follow Up Recommendations  No OT follow up    Assistance Recommended at Discharge PRN  Patient can return home with the following A little help with bathing/dressing/bathroom;Assistance with cooking/housework    Functional Status Assessment  Patient has had a recent decline in their functional status and demonstrates the ability to make significant improvements in function in a reasonable and predictable amount of time.  Equipment Recommendations  None recommended by OT    Recommendations for Other Services       Precautions / Restrictions Precautions Precautions: Other (comment) Precaution Comments: abdominal surgery, instructed pt in log roll Restrictions Weight Bearing Restrictions: No      Mobility Bed Mobility Overal bed mobility: Needs Assistance Bed Mobility: Rolling, Sidelying to Sit Rolling: Supervision Sidelying to sit: Min assist       General bed mobility comments: VCs for log rolling technique    Transfers Overall  transfer level: Modified independent Equipment used: None Transfers: Sit to/from Stand Sit to Stand: Modified independent (Device/Increase time), From elevated surface                      ADL either performed or assessed with clinical judgement   ADL Overall ADL's : Needs assistance/impaired     Grooming: Sitting;Wash/dry face;Cueing for sequencing;Cueing for safety   Upper Body Bathing: Minimal assistance;Sitting   Lower Body Bathing: Minimal assistance;Sit to/from stand;Cueing for safety;Cueing for sequencing   Upper Body Dressing : Set up;Sitting   Lower Body Dressing: Minimal assistance;Sit to/from stand;Cueing for safety;Cueing for sequencing;Cueing for compensatory techniques   Toilet Transfer: Minimal assistance;BSC/3in1;Cueing for safety;Cueing for sequencing   Toileting- Clothing Manipulation and Hygiene: Minimal assistance;Sit to/from stand;Cueing for safety;Cueing for sequencing               Vision Patient Visual Report: No change from baseline              Pertinent Vitals/Pain Pain Assessment Pain Score: 3  Pain Location: abdominal incision Pain Descriptors / Indicators: Grimacing, Sore Pain Intervention(s): Repositioned        Extremity/Trunk Assessment         Cervical / Trunk Assessment Cervical / Trunk Assessment: Normal   Communication Communication Communication: No difficulties   Cognition Arousal/Alertness: Awake/alert Behavior During Therapy: WFL for tasks assessed/performed Overall Cognitive Status: Within Functional Limits for tasks assessed  Home Living Family/patient expects to be discharged to:: Private residence Living Arrangements: Alone Available Help at Discharge: Family;Available PRN/intermittently Type of Home: House       Home Layout: Multi-level Alternate Level Stairs-Number of Steps: 3 level home             Home Equipment:  None          Prior Functioning/Environment Prior Level of Function : Independent/Modified Independent             Mobility Comments: pt reports a fall at [facility] just prior to admission, he was light headed due to having not eaten, no other falls in past 6 months          OT Problem List: Decreased strength;Impaired balance (sitting and/or standing)      OT Treatment/Interventions: Self-care/ADL training;Patient/family education;DME and/or AE instruction    OT Goals(Current goals can be found in the care plan section) Acute Rehab OT Goals Patient Stated Goal: home asap OT Goal Formulation: With patient Time For Goal Achievement: 05/16/22 Potential to Achieve Goals: Good  OT Frequency: Min 2X/week       AM-PAC OT "6 Clicks" Daily Activity     Outcome Measure   Help from another person taking care of personal grooming?: A Little Help from another person toileting, which includes using toliet, bedpan, or urinal?: A Little Help from another person bathing (including washing, rinsing, drying)?: A Little Help from another person to put on and taking off regular upper body clothing?: A Little Help from another person to put on and taking off regular lower body clothing?: A Little 6 Click Score: 15   End of Session Nurse Communication: Mobility status  Activity Tolerance: Patient tolerated treatment well Patient left: in chair;with call bell/phone within reach  OT Visit Diagnosis: Unsteadiness on feet (R26.81);Muscle weakness (generalized) (M62.81)                Time: 1607-3710 OT Time Calculation (min): 16 min Charges:  OT General Charges $OT Visit: 1 Visit OT Evaluation $OT Eval Low Complexity: 1 Low  Kari Baars, OT Acute Rehabilitation Services [phone] Office- [phone], Edwena Felty D 05/02/2022, 4:09 PM

## 2022-05-02 NOTE — Plan of Care (Signed)
  Problem: Activity: Goal: Ability to tolerate increased activity will improve Outcome: Progressing   Problem: Nutritional: Goal: Will attain and maintain optimal nutritional status will improve Outcome: Progressing   Problem: Activity: Goal: Risk for activity intolerance will decrease Outcome: Progressing

## 2022-05-02 NOTE — Evaluation (Addendum)
Physical Therapy Evaluation Patient Details Name: Calvin Brooks MRN: [id number] DOB: 1956/06/18 Today's Date: 05/02/2022  History of Present Illness  66 y.o. male admitted for colectomy and LOA on 04/29/22. PMH: Crohn's, PVD, HTN, lung nodules.  Clinical Impression  Pt ambulated 500' with RW without loss of balance. Instructed pt in log roll technique to minimize strain to surgical site while getting out of bed. Order placed for a RW. He does not need further PT, will sign off. Pt was encouraged to continue ambulating in the halls TID.        Recommendations for follow up therapy are one component of a multi-disciplinary discharge planning process, led by the attending physician.  Recommendations may be updated based on patient status, additional functional criteria and insurance authorization.  Follow Up Recommendations No PT follow up      Assistance Recommended at Discharge PRN  Patient can return home with the following  Assist for transportation    Equipment Recommendations Rolling walker (2 wheels)  Recommendations for Other Services       Functional Status Assessment Patient has had a recent decline in their functional status and demonstrates the ability to make significant improvements in function in a reasonable and predictable amount of time.     Precautions / Restrictions Precautions Precautions: Other (comment) Precaution Comments: abdominal surgery, instructed pt in log roll Restrictions Weight Bearing Restrictions: No      Mobility  Bed Mobility Overal bed mobility: Needs Assistance Bed Mobility: Rolling, Sidelying to Sit Rolling: Supervision Sidelying to sit: Min assist       General bed mobility comments: VCs for log rolling technique    Transfers Overall transfer level: Modified independent Equipment used: None Transfers: Sit to/from Stand Sit to Stand: Modified independent (Device/Increase time), From elevated surface                 Ambulation/Gait Ambulation/Gait assistance: Modified independent (Device/Increase time) Gait Distance (Feet): 500 Feet Assistive device: Rolling walker (2 wheels) Gait Pattern/deviations: WFL(Within Functional Limits) Gait velocity: WFL     General Gait Details: steady, no loss of balance, VCs to lift head  Stairs            Wheelchair Mobility    Modified Rankin (Stroke Patients Only)       Balance Overall balance assessment: Modified Independent                                           Pertinent Vitals/Pain Pain Assessment Pain Assessment: 0-10 Pain Score: 3  Pain Location: abdominal incision Pain Descriptors / Indicators: Grimacing, Sore Pain Intervention(s): Limited activity within patient's tolerance, Monitored during session, Premedicated before session    Home Living Family/patient expects to be discharged to:: Private residence Living Arrangements: Alone Available Help at Discharge: Family;Available PRN/intermittently Type of Home: House       Alternate Level Stairs-Number of Steps: 3 level home Home Layout: Multi-level Home Equipment: None      Prior Function Prior Level of Function : Independent/Modified Independent             Mobility Comments: pt reports a fall at [facility] just prior to admission, he was light headed due to having not eaten, no other falls in past 6 months       Hand Dominance        Extremity/Trunk Assessment   Upper Extremity Assessment Upper Extremity  Assessment: Overall WFL for tasks assessed    Lower Extremity Assessment Lower Extremity Assessment: Overall WFL for tasks assessed    Cervical / Trunk Assessment Cervical / Trunk Assessment: Normal  Communication   Communication: No difficulties  Cognition Arousal/Alertness: Awake/alert Behavior During Therapy: WFL for tasks assessed/performed Overall Cognitive Status: Within Functional Limits for tasks assessed                                           General Comments      Exercises     Assessment/Plan    PT Assessment Patient does not need any further PT services  PT Problem List         PT Treatment Interventions      PT Goals (Current goals can be found in the Care Plan section)  Acute Rehab PT Goals Patient Stated Goal: to be able to walk around the block PT Goal Formulation: All assessment and education complete, DC therapy    Frequency       Co-evaluation               AM-PAC PT "6 Clicks" Mobility  Outcome Measure Help needed turning from your back to your side while in a flat bed without using bedrails?: None Help needed moving from lying on your back to sitting on the side of a flat bed without using bedrails?: A Little Help needed moving to and from a bed to a chair (including a wheelchair)?: None Help needed standing up from a chair using your arms (e.g., wheelchair or bedside chair)?: None Help needed to walk in hospital room?: None Help needed climbing 3-5 steps with a railing? : None 6 Click Score: 23    End of Session   Activity Tolerance: Patient tolerated treatment well Patient left: in bed;with call bell/phone within reach Nurse Communication: Mobility status      Time: 5726-2035 PT Time Calculation (min) (ACUTE ONLY): 15 min   Charges:   PT Evaluation $PT Eval Low Complexity: 1 Low          Philomena Doheny PT 05/02/2022  Acute Rehabilitation Services  Office [phone]

## 2022-05-03 NOTE — Progress Notes (Signed)
4 Days Post-Op   Subjective/Chief Complaint: Pt doing well this AM No flatus/BM Tol fulls mobilizing   Objective: Vital signs in last 24 hours: Temp:  [97.7 F (36.5 C)-98.5 F (36.9 C)] 97.7 F (36.5 C) (10/22 0521) Pulse Rate:  [76-80] 79 (10/22 0521) Resp:  [12-18] 16 (10/22 0521) BP: (127-152)/(73-90) 152/90 (10/22 0521) SpO2:  [95 %-98 %] 97 % (10/22 0521) Weight:  [90.3 kg] 90.3 kg (10/22 0500) Last BM Date : 04/28/22  Intake/Output from previous day: 10/21 0701 - 10/22 0700 In: 1380 [P.O.:1380] Out: 2625 [Urine:2625] Intake/Output this shift: Total I/O In: -  Out: 400 [Urine:400]  General appearance: alert and cooperative GI: soft, non-tender; bowel sounds normal; no masses,  no organomegaly and abd flat, inc c/d/i  Lab Results:  Recent Labs    05/02/22 0036  WBC 10.6*  HGB 10.2*  HCT 31.3*  PLT 258   BMET Recent Labs    05/02/22 0036  K 3.8  CREATININE 0.70   PT/INR No results for input(s): "LABPROT", "INR" in the last 72 hours. ABG No results for input(s): "PHART", "HCO3" in the last 72 hours.  Invalid input(s): "PCO2", "PO2"  Studies/Results: No results found.  Anti-infectives: Anti-infectives (From admission, onward)    Start     Dose/Rate Route Frequency Ordered Stop   04/30/22 0645  ertapenem (INVANZ) 1,000 mg in sodium chloride 0.9 % 100 mL IVPB        1 g 200 mL/hr over 30 Minutes Intravenous Every 24 hours 04/30/22 0556 05/04/22 0644   04/29/22 1030  ertapenem (INVANZ) 1,000 mg in sodium chloride 0.9 % 100 mL IVPB        1 g 200 mL/hr over 30 Minutes Intravenous On call to O.R. 04/29/22 1022 04/29/22 1225       Assessment/Plan: s/p Procedure(s): ROBOTIC ILEOCECECTOMY OF CROHNS STRICTURES AND LYSIS OF ADHESIONS (N/A) Advance diet to soft today Mobilize Await bowel fxn Abx x 5d   LOS: 4 days    Ralene Ok 05/03/2022

## 2022-05-04 ENCOUNTER — Other Ambulatory Visit (HOSPITAL_COMMUNITY): Payer: Self-pay

## 2022-05-04 MED ORDER — METHOCARBAMOL 500 MG PO TABS: 500.0000 mg | ORAL_TABLET | Freq: Four times a day (QID) | ORAL | Status: DC | PRN

## 2022-05-04 NOTE — Anesthesia Postprocedure Evaluation (Signed)
Anesthesia Post Note  Patient: Calvin Brooks  Procedure(s) Performed: ROBOTIC ILEOCECECTOMY OF CROHNS STRICTURES AND LYSIS OF ADHESIONS (Abdomen)     Patient location during evaluation: PACU Anesthesia Type: General Level of consciousness: awake and alert Pain management: pain level controlled Vital Signs Assessment: post-procedure vital signs reviewed and stable Respiratory status: spontaneous breathing, nonlabored ventilation, respiratory function stable and patient connected to nasal cannula oxygen Cardiovascular status: blood pressure returned to baseline and stable Postop Assessment: no apparent nausea or vomiting Anesthetic complications: no   No notable events documented.  Last Vitals:  Vitals:   05/03/22 2114 05/04/22 0528  BP: 129/78 110/61  Pulse: 84 (!) 105  Resp: 16 16  Temp: 37.1 C 36.6 C  SpO2: 98% 98%    Last Pain:  Vitals:   05/04/22 0528  TempSrc: Oral  PainSc:                  March Rummage Dawne Casali

## 2022-05-04 NOTE — Progress Notes (Signed)
Calvin Brooks [id number] [birth year]  CARE TEAM:  PCP: Kathalene Frames, MD  Outpatient Care Team: Patient Care Team: Kathalene Frames, MD as PCP - General (Internal Medicine) Golden Pop, MD as Referring Physician (Colon and Rectal Surgery) Wilford Corner, MD as Consulting Physician (Gastroenterology) Michael Boston, MD as Consulting Physician (General Surgery)  Inpatient Treatment Team: Treatment Team: Attending Provider: Michael Boston, MD; Charge Nurse: Kerney Elbe, RN; Technician: Jeralyn Ruths, NT; Pharmacist: Eudelia Bunch, [facility]; Utilization Review: Hetty Ely, RN; Mobility Specialist: Shelly Coss   Problem List:   Principal Problem:   Crohn's disease of ileum with intestinal obstruction [facility]) Active Problems:   GERD (gastroesophageal reflux disease)   Protein-calorie malnutrition, moderate (HCC)   Gallstones   Multiple lung nodules on CT   Peripheral vascular disease ([place])   5 Days Post-Op  04/29/2022  POST-OPERATIVE DIAGNOSIS:    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  Distal third of small intestine very dilated with fecalization.  Inflamed stricture at ileocolonic anastomosis from distal ileum to ascending colon.  Dense interloop adhesions to this region causing an accordion folds x4 and intermittent closed-loop like obstructions.  Fecalization.  3 cm abscess noted at the epicenter of the anastomotic stricture and small bowel adhesions.  Segment of ileum with partial stricture within 20 cm of ileocolonic anastomotic  stricture.  En bloc resection done.   It is an antiperistaltic ileocolonic anastomosis (distal ileum to proximal transverse colon) that rests in the pelvic region.  Patient has most of small intestine remaining, 300 cm.   CASE DATA:   Type of patient?: Elective [facility] Private Case   Status of Case? Elective Scheduled   Infection Present At Time Of Surgery (PATOS)?  ABSCESS   FINAL MICROSCOPIC DIAGNOSIS:   A. ILEOCOLONIC ANASTAMOSIS, RESECTION:  Acute and chronic ileitis with extensive pyloric metaplasia and  periileal abscess, fibrosis and stricture compatible with Crohn's  disease  Dense fibrous serosal adhesions and acute serositis  Margins viable and free of acute inflammation  Chronic ileitis with extensive pyloric metaplasia present in proximal  margin  Negative for granulomas and dysplasia  One benign lymph node   Assessment  Stabilizing.    [facility] Stay = 5 days)  Plan:  ERAS protocol.  See if we can slowly advance as long as he is not nauseated.  Supplemental shakes.  Advance to heart healthy solid diet today.  Patient very high risk for postoperative ileus given extensive lysis of adhesions for very dense adhesions.  Appears to be less likely today.  Vance diet and follow  Chronic iron deficiency anemia and anemia of chronic disease.  Hemoglobin not severely low.  Hemoglobin of 10.  Hold off on more aggressive supplementation.  Follow.  With interoperative abscess found, antibiotics for 5 days total to minimize risk of recurrence since he is malnourished and immunosuppressed.  Completing today.  Suspect he is moderately malnourished.  Advancing diet.  Hopefully TPN less likely.  Follow-up on pathology.  No malignancy chronic metaplasia noted..  Another abscess noted in mesentery.  Some active and chronic Crohn's disease.  Discussed with patient.  Strongly recommend he continue the plan to follow-up with Dr. Michail Sermon with [facility] gastroenterology to discuss  immunosuppressive regimen.  Hopefully they off steroids yet have a regimen that avoids a fourth ileocolonic stricturing  -VTE prophylaxis- SCDs, etc  -mobilize as tolerated to help recovery.  Given his somewhat deconditioned state and challenging historian, ask therapies to objectively evaluate and make sure that we are not missing anything.  They feel that he is improving well.  He is mobilizing well.  No external mouth likely needed  Disposition:  Disposition:  The patient is from: Home  Anticipate discharge to:  Home  Anticipated Date of Discharge is:  October 24,2023    Barriers to discharge:  Pending Clinical improvement (more likely than not)  Patient currently is NOT MEDICALLY STABLE for discharge from the hospital from a surgery standpoint.      I reviewed nursing notes, last 24 h vitals and pain scores, last 48 h intake and output, last 24 h labs and trends, and last 24 h imaging results. I have reviewed this patient's available data, including medical history, events of note, test results, etc as part of my evaluation.  A significant portion of that time was spent in counseling.  Care during the described time interval was provided by me.  This care required moderate level of medical decision making.  05/04/2022    Subjective: (Chief complaint)  Patient feeling better overall.  Tolerated some thick liquids.  Wanting to consider something more solid.  Walking better in hallways.  Pain better controlled.    Objective:  Vital signs:  Vitals:   05/03/22 0521 05/03/22 1326 05/03/22 2114 05/04/22 0528  BP: (!) 152/90 127/80 129/78 110/61  Pulse: 79 78 84 (!) 105  Resp: 16 16 16 16   Temp: 97.7 F (36.5 C) 97.8 F (36.6 C) 98.7 F (37.1 C) 97.8 F (36.6 C)  TempSrc: Oral Oral Oral Oral  SpO2: 97% 98% 98% 98%  Weight:      Height:        Last BM Date : 05/04/22  Intake/Output   Yesterday:  10/22 0701 - 10/23 0700 In: 920 [P.O.:720; IV  Piggyback:200] Out: 1000 [Urine:1000] This shift:  No intake/output data recorded.  Bowel function:  Flatus: YES  BM:  YES  Drain: (No drain)   Physical Exam:  General: Pt awake/alert in no acute distress.  Walking around in room.  No guarding or hesitancy.   Eyes: PERRL, normal EOM.  Sclera clear.  No icterus Neuro: CN II-XII intact w/o focal sensory/motor deficits. Lymph: No head/neck/groin lymphadenopathy Psych:  No delerium/psychosis/paranoia.  Oriented x 4 HENT: Normocephalic, Mucus membranes moist.  No thrush Neck: Supple, No tracheal deviation.  No obvious thyromegaly Chest: No pain to chest wall compression.  Good respiratory excursion.  No audible wheezing CV:  Pulses intact.  Regular rhythm.  No major extremity edema MS: Normal AROM mjr joints.  No obvious deformity  Abdomen: Soft.  Nondistended.  Nontender.  Dressings c/d/i.  No evidence of peritonitis.  No incarcerated hernias.  Ext:   No deformity.  No mjr edema.  No cyanosis Skin: No petechiae / purpurea.  No major sores.  Warm and dry    Results:   SURGICAL PATHOLOGY  CASE: [id number]  PATIENT: Milana Obey  Surgical Pathology Report      Clinical History: ileocolonic anastomotic stricture      FINAL MICROSCOPIC DIAGNOSIS:   A. ILEOCOLONIC ANASTAMOSIS, RESECTION:  Acute and chronic ileitis with extensive pyloric metaplasia and  periileal abscess, fibrosis and stricture compatible with Crohn's  disease  Dense fibrous serosal adhesions and acute serositis  Margins viable and free of acute inflammation  Chronic ileitis with extensive pyloric metaplasia present in proximal  margin  Negative for granulomas and dysplasia  One benign lymph node    Mccoy Testa DESCRIPTION:   Received fresh is a segment of intestine clinically identified as  ileocolonic anastomosis consisting of 31 cm of small intestine and 14 cm  of colon.  The line of anastomosis grossly appears intact and there are  dense,  focally hemorrhagic adhesions surrounding the anastomotic site.  The small intestine is dilated measuring up to 7 cm in diameter and in  the distal 21 cm the mucosa is ulcerated, red-brown and the wall is  thickened measuring up to 1 cm.  A perforation of the wall is not  identified.  The lumen proximal to the anastomosis is stenotic measuring  less than 1 cm in diameter.  The mucosa lining the portion of colon is  edematous, tan-red.  Sections are submitted in 8 cassettes.  1 = proximal margin  2 = distal margin  3-6 = sections of ileum  7 = colon  8 = lymph nodes (GRP 04/30/2022)    Final Diagnosis performed by Tobin Chad, MD.   Electronically  signed 05/01/2022  Technical component performed at [facility], [place]  [address], [place], [place] [address].   Professional component performed at [employer]. [facility],  [place] [address], [place], [place] [address].   Immunohistochemistry Technical component (if applicable) was performed  at [facility]. [address], [place],  [place], [place] [address].   IMMUNOHISTOCHEMISTRY DISCLAIMER (if applicable):  Some of these immunohistochemical stains may have been developed and the  performance characteristics determine by [facility]. Some  may not have been cleared or approved by the U.S. Food and Drug  Administration. The FDA has determined that such clearance or approval  is not necessary. This test is used for clinical purposes. It should not  be regarded as investigational or for research. This laboratory is  certified under the [place]  (CLIA-88) as qualified to perform high complexity clinical laboratory  testing.  The controls stained appropriately.   Cultures: Recent Results (from the past 720 hour(s))  SARS Coronavirus 2 by RT PCR (hospital order, performed in [facility] hospital lab) *cepheid single result test* Anterior  Nasal Swab     Status: None   Collection Time: 04/26/22 10:02 AM   Specimen: Anterior Nasal Swab  Result Value Ref Range Status   SARS Coronavirus 2 by RT PCR NEGATIVE NEGATIVE Final    Comment: (NOTE) SARS-CoV-2 target nucleic acids are NOT DETECTED.  The SARS-CoV-2 RNA is generally detectable in upper and lower respiratory specimens during the acute phase of infection. The lowest concentration of SARS-CoV-2 viral copies this assay can detect is 250 copies / mL. A negative result does not preclude SARS-CoV-2 infection and should not be used as the sole basis for treatment or other patient management decisions.  A negative result may occur with improper specimen collection / handling, submission of specimen other than nasopharyngeal swab, presence of viral mutation(s) within the areas targeted by this assay, and inadequate number of viral copies (<250 copies / mL). A negative result must be combined with clinical observations, patient history, and epidemiological information.  Fact Sheet for Patients:   https://www.patel.info/  Fact Sheet for Healthcare Providers:  https://hall.com/  This test is not yet approved or  cleared by the [place] and has been authorized for detection and/or diagnosis of SARS-CoV-2 by FDA under an Emergency Use Authorization (EUA).  This EUA will remain in effect (meaning this test can be used) for the duration of the COVID-19 declaration under Section 564(b)(1) of the Act, 21 U.S.C. section 360bbb-3(b)(1), unless the authorization is terminated or revoked sooner.  Performed at [facility], [place] [address] [place], [place] [address]     Labs: No results found for this or any previous visit (from the past 48 hour(s)).   Imaging / Studies: No results found.  Medications / Allergies: per chart  Antibiotics: Anti-infectives (From admission, onward)    Start     Dose/Rate Route Frequency  Ordered Stop   04/30/22 0645  ertapenem (INVANZ) 1,000 mg in sodium chloride 0.9 % 100 mL IVPB        1 g 200 mL/hr over 30 Minutes Intravenous Every 24 hours 04/30/22 0556 05/04/22 0644   04/29/22 1030  ertapenem (INVANZ) 1,000 mg in sodium chloride 0.9 % 100 mL IVPB        1 g 200 mL/hr over 30 Minutes Intravenous On call to O.R. 04/29/22 1022 04/29/22 1225         Note: Portions of this report may have been transcribed using voice recognition software. Every effort was made to ensure accuracy; however, inadvertent computerized transcription errors may be present.   Any transcriptional errors that result from this process are unintentional.    Adin Hector, MD, FACS, MASCRS Esophageal, Gastrointestinal & Colorectal Surgery Robotic and Minimally Invasive Surgery  [facility]. [address] [place], [place] [address] [phone] Fax [phone] Main  CONTACT INFORMATION:  Weekday (9AM-5PM): Call [facility] main office at [phone]  Weeknight (5PM-9AM) or Weekend/Holiday: Check [email or web address] (password " [email or web address]") for General Surgery [facility] coverage  (Please, do not use SecureChat as it is not reliable communication to reach operating surgeons for immediate patient care given surgeries/outpatient duties/clinic/cross-coverage/off post-call which would lead to a delay in care.  Epic staff messaging available for outptient concerns, but may not be answered for 48 hours or more).     05/04/2022  8:30 AM

## 2022-05-05 ENCOUNTER — Other Ambulatory Visit (HOSPITAL_COMMUNITY): Payer: Self-pay

## 2022-05-05 NOTE — Progress Notes (Signed)
Alerted by RN that pt in need of RW - pt without DME agency preference.  Order placed with [facility] for delivery to room.  No further TOC needs.  Kenzlie Disch, LCSW

## 2022-05-05 NOTE — Discharge Summary (Signed)
Physician Discharge Summary    Patient ID: Calvin Brooks MRN: [id number] DOB/AGE: 1956-01-06  66 y.o.  Patient Care Team: Kathalene Frames, MD as PCP - General (Internal Medicine) Golden Pop, MD as Referring Physician (Colon and Rectal Surgery) Wilford Corner, MD as Consulting Physician (Gastroenterology) Michael Boston, MD as Consulting Physician (General Surgery)  Admit date: 04/29/2022  Discharge date: 05/05/2022  Hospital Stay = 6 days    Discharge Diagnoses:  Principal Problem:   Crohn's disease of ileum with intestinal obstruction ([place]) Active Problems:   GERD (gastroesophageal reflux disease)   Protein-calorie malnutrition, moderate (HCC)   Gallstones   Multiple lung nodules on CT   Peripheral vascular disease ([place])   6 Days Post-Op  04/29/2022  POST-OPERATIVE DIAGNOSIS:    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  Distal third of small intestine very dilated with fecalization.  Inflamed stricture at ileocolonic anastomosis from distal ileum to ascending colon.  Dense interloop adhesions to this region causing an accordion folds x4 and intermittent closed-loop like obstructions.  Fecalization.  3 cm abscess noted at the epicenter of the anastomotic stricture and small bowel adhesions.  Segment of ileum with partial stricture within 20 cm of ileocolonic anastomotic stricture.  En bloc resection done.   It is an antiperistaltic ileocolonic anastomosis (distal ileum to proximal transverse colon) that rests in the pelvic region.  Patient has most of small intestine  remaining, 300 cm.   CASE DATA:   Type of patient?: Elective [facility] Private Case   Status of Case? Elective Scheduled   Infection Present At Time Of Surgery (PATOS)?  ABSCESS     FINAL MICROSCOPIC DIAGNOSIS:    A. ILEOCOLONIC ANASTAMOSIS, RESECTION:  Acute and chronic ileitis with extensive pyloric metaplasia and  periileal abscess, fibrosis and stricture compatible with Crohn's  disease  Dense fibrous serosal adhesions and acute serositis  Margins viable and free of acute inflammation  Chronic ileitis with extensive pyloric metaplasia present in proximal  margin  Negative for granulomas and dysplasia  One benign lymph node   Consults: Case Management / Social Work, Physical Therapy, Occupational Therapy, Nutrition, and Anesthesia  Hospital Course:   The patient underwent  the surgery above.  Postoperatively, the patient gradually mobilized and advanced to a solid diet.  Pain and other symptoms were treated aggressively.    By the time of discharge, the patient was walking well the hallways, eating food, having flatus.  Pain was well-controlled on an oral medications.  Based on meeting discharge criteria and continuing to recover, I felt it was safe for the patient to be discharged from the hospital to further recover with close followup. Postoperative recommendations were discussed in detail.  They are written as well.  Discharged Condition: good  Discharge Exam: Blood pressure 135/82, pulse 74, temperature 98.6 F (37 C), temperature source Oral, resp. rate 16, height 6\' 4"  (1.93 m), weight 90.3 kg, SpO2 100 %.  General: Pt awake/alert/oriented x4 in No acute distress Eyes: PERRL, normal EOM.  Sclera clear.  No icterus Neuro: CN II-XII intact w/o focal sensory/motor deficits. Lymph: No head/neck/groin lymphadenopathy Psych:  No  delerium/psychosis/paranoia HENT: Normocephalic, Mucus membranes moist.  No thrush Neck: Supple, No tracheal deviation Chest:  No chest wall pain w  good excursion CV:  Pulses intact.  Regular rhythm MS: Normal AROM mjr joints.  No obvious deformity Abdomen: Soft.  Nondistended.  Nontender.  No evidence of peritonitis.  No incarcerated hernias. Ext:  SCDs BLE.  No mjr edema.  No cyanosis Skin: No petechiae / purpura   Disposition:    Follow-up Information     Michael Boston, MD. Schedule an appointment as soon as possible for a visit on 05/18/2022.   Specialties: General Surgery, Colon and Rectal Surgery Why: To follow up after your operation        Wilford Corner, MD Follow up in 1 month(s).   Specialty: Gastroenterology Why: To follow up after your hospital stay & plan Crohn's treatment Contact information: [address] [place] [address] [phone]                 Discharge disposition: 01-Home or Self Care       Discharge Instructions     Call MD for:   Complete by: As directed    FEVER > 101.5 F  (temperatures < 101.5 F are not significant)   Call MD for:  extreme fatigue   Complete by: As directed    Call MD for:  persistant dizziness or light-headedness   Complete by: As directed    Call MD for:  persistant nausea and vomiting   Complete by: As directed    Call MD for:  redness, tenderness, or signs of infection (pain, swelling, redness, odor or green/yellow discharge around incision site)   Complete by: As directed    Call MD for:  severe uncontrolled pain   Complete by: As directed    Diet - low sodium heart healthy   Complete by: As directed    Start with a bland diet such as soups, liquids, starchy foods, low fat foods, etc. the first few days at home. Gradually advance to a solid, low-fat, high fiber diet by the end of the first week at home.   Add a fiber supplement to your diet (Metamucil, etc) If you feel full, bloated, or constipated, stay on a full liquid or pureed/blenderized diet for a few days until you feel better and are no longer constipated.   Discharge  instructions   Complete by: As directed    See Discharge Instructions If you are not getting better after two weeks or are noticing you are getting worse, contact our office [phone] for further advice.  We may need to adjust your medications, re-evaluate you in the office, send you to the emergency room, or see what other things we can do to help. The clinic staff is available to answer your questions during regular business hours (8:30am-5pm).  Please don't hesitate to call and ask to speak to one of our nurses for clinical concerns.    A surgeon from [facility] is always on call at the hospitals 24 hours/day If you have a medical emergency, go to the nearest emergency room or call 911.   Discharge wound care:   Complete by: As directed    It is good for closed incisions and even open wounds to be washed every day.  Shower every day.  Short baths are fine.  Wash the incisions and wounds clean with soap & water.    You may leave closed incisions open to air if  it is dry.   You may cover the incision with clean gauze & replace it after your daily shower for comfort.  TEGADERM:  You have clear gauze band-aid dressings over your closed incision(s).  Remove the dressings 3 days after surgery.   Driving Restrictions   Complete by: As directed    You may drive when: - you are no longer taking narcotic prescription pain medication - you can comfortably wear a seatbelt - you can safely make sudden turns/stops without pain.   Increase activity slowly   Complete by: As directed    Start light daily activities --- self-care, walking, climbing stairs- beginning the day after surgery.  Gradually increase activities as tolerated.  Control your pain to be active.  Stop when you are tired.  Ideally, walk several times a day, eventually an hour a day.   Most people are back to most day-to-day activities in a few weeks.  It takes 4-6 weeks to get back to unrestricted, intense activity. If  you can walk 30 minutes without difficulty, it is safe to try more intense activity such as jogging, treadmill, bicycling, low-impact aerobics, swimming, etc. Save the most intensive and strenuous activity for last (Usually 4-8 weeks after surgery) such as sit-ups, heavy lifting, contact sports, etc.  Refrain from any intense heavy lifting or straining until you are off narcotics for pain control.  You will have off days, but things should improve week-by-week. DO NOT PUSH THROUGH PAIN.  Let pain be your guide: If it hurts to do something, don't do it.   Lifting restrictions   Complete by: As directed    If you can walk 30 minutes without difficulty, it is safe to try more intense activity such as jogging, treadmill, bicycling, low-impact aerobics, swimming, etc. Save the most intensive and strenuous activity for last (Usually 4-8 weeks after surgery) such as sit-ups, heavy lifting, contact sports, etc.   Refrain from any intense heavy lifting or straining until you are off narcotics for pain control.  You will have off days, but things should improve week-by-week. DO NOT PUSH THROUGH PAIN.  Let pain be your guide: If it hurts to do something, don't do it.  Pain is your body warning you to avoid that activity for another week until the pain goes down.   May shower / Bathe   Complete by: As directed    May walk up steps   Complete by: As directed    Sexual Activity Restrictions   Complete by: As directed    You may have sexual intercourse when it is comfortable. If it hurts to do something, stop.       Allergies as of 05/05/2022       Reactions   Nsaids Other (See Comments)   Crohn's disease   Penicillins    Childhood allergy Has patient had a PCN reaction causing immediate rash, facial/tongue/throat swelling, SOB or lightheadedness with hypotension: Unknown Has patient had a PCN reaction causing severe rash involving mucus membranes or skin necrosis: Unknown Has patient had a PCN  reaction that required hospitalization: Unknown Has patient had a PCN reaction occurring within the last 10 years: No If all of the above answers are "NO", then may proceed with Cephalosporin use.        Medication List     TAKE these medications    acetaminophen 500 MG tablet Commonly known as: TYLENOL Take 1,000 mg by mouth every 6 (six) hours as needed for moderate pain.  B-12 5000 MCG Subl Place 5,000 mcg under the tongue in the morning.   CALCIUM-D PO Take 1 tablet by mouth daily at 6 PM.   ferrous sulfate 325 (65 FE) MG tablet Take 325 mg by mouth in the morning.   multivitamin with minerals Tabs tablet Take 1 tablet by mouth in the morning.   Pentasa 500 MG CR capsule Generic drug: mesalamine Take 1,000 mg by mouth 4 (four) times daily.   Prevalite 4 g packet Generic drug: cholestyramine light Take 4 g by mouth in the morning.   PROBIOTIC PO Take 1 capsule by mouth every Monday, Tuesday, Wednesday, Thursday, and Friday. Five times a week   rosuvastatin 10 MG tablet Commonly known as: CRESTOR Take 10 mg by mouth at bedtime.   traMADol 50 MG tablet Commonly known as: ULTRAM Take 1-2 tablets (50-100 mg total) by mouth every 6 (six) hours as needed for moderate pain or severe pain.   Vitamin D3 50 MCG (2000 UT) Tabs Take 2,000 Units by mouth daily with lunch.               Durable Medical Equipment  (From admission, onward)           Start     Ordered   05/02/22 1156  For home use only DME Walker rolling  Once       Question Answer Comment  Walker: With [place]   Patient needs a walker to treat with the following condition Difficulty in walking, not elsewhere classified      05/02/22 1155              Discharge Care Instructions  (From admission, onward)           Start     Ordered   04/29/22 0000  Discharge wound care:       Comments: It is good for closed incisions and even open wounds to be washed every day.  Shower  every day.  Short baths are fine.  Wash the incisions and wounds clean with soap & water.    You may leave closed incisions open to air if it is dry.   You may cover the incision with clean gauze & replace it after your daily shower for comfort.  TEGADERM:  You have clear gauze band-aid dressings over your closed incision(s).  Remove the dressings 3 days after surgery.   04/29/22 1104            Significant Diagnostic Studies:  No results found for this or any previous visit (from the past 72 hour(s)).  DG Abd 1 View  Result Date: 04/29/2022 CLINICAL DATA:  NG tube placement.  Postoperative bowel surgery. EXAM: ABDOMEN - 1 VIEW COMPARISON:  12/10/2021 FINDINGS: Limited radiograph of the lower chest and upper abdomen was obtained for the purposes of enteric tube localization. No enteric tube is seen within the field of view. Small-moderate volume pneumoperitoneum is present as well as abdominal wall subcutaneous emphysema. IMPRESSION: 1. No enteric tube is seen within the field of view. 2. Small-moderate volume pneumoperitoneum and abdominal wall subcutaneous emphysema. Findings are presumably related to history of bowel surgery. Electronically Signed   By: Davina Poke D.O.   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    Past Surgical History:  Procedure Laterality Date   COLON SURGERY     COLONOSCOPY WITH PROPOFOL N/A 12/21/2016   Procedure: COLONOSCOPY WITH PROPOFOL;  Surgeon: Garlan Fair, MD;  Location: [facility];  Service: Endoscopy;  Laterality: N/A;   ILEOCECETOMY  1987   Crohns   PARTIAL COLECTOMY  1997   anastomotic stricture   TAKE DOWN OF INTESTINAL  FISTULA  1987   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   TONSILLECTOMY      Social History   Socioeconomic History   Marital status: Divorced    Spouse name: Not on file   Number of children: Not on file   Years of education: Not on file   Highest education level: Not on file  Occupational History   Not on file  Tobacco Use   Smoking status: Every Day    Packs/day: 1.00    Years: 43.50    Total pack years: 43.50    Types: Cigarettes   Smokeless tobacco: Never  Vaping Use   Vaping Use: Never used  Substance and Sexual Activity   Alcohol use: No    Comment: very rare   Drug use: No   Sexual activity: Not Currently  Other Topics Concern   Not on file  Social History Narrative   Not on file   Social Determinants of Health   Financial Resource Strain: Not on file  Food Insecurity: No Food Insecurity (04/29/2022)   Hunger Vital Sign    Worried About Running Out of Food in the Last Year: Never true    Ran Out of Food in the Last Year: Never true  Transportation Needs: No Transportation Needs (04/29/2022)   PRAPARE - Hydrologist (Medical): No    Lack of Transportation (Non-Medical): No  Physical Activity: Not on file  Stress: Not on file  Social Connections: Not on file  Intimate Partner Violence: Not At Risk (04/29/2022)   Humiliation, Afraid, Rape, and Kick questionnaire    Fear of Current or Ex-Partner: No    Emotionally Abused: No    Physically Abused: No    Sexually Abused: No    History reviewed. No pertinent family history.  Current Facility-Administered Medications  Medication Dose Route Frequency Provider Last Rate Last Admin   acetaminophen (TYLENOL) tablet 1,000 mg  1,000 mg Oral Lajuana Ripple, MD   1,000 mg at 05/05/22 0544   alum & mag hydroxide-simeth (MAALOX/MYLANTA) 200-200-20 MG/5ML suspension 30 mL  30 mL Oral Q6H PRN Michael Boston, MD       cholecalciferol (VITAMIN D3) tablet 2,000 Units  2,000 Units Oral Q  lunch Michael Boston, MD   2,000 Units at 05/04/22 1208   cyanocobalamin (VITAMIN B12) tablet 5,000 mcg  5,000 mcg Oral Daily Michael Boston, MD   5,000 mcg at 05/04/22 1035   diphenhydrAMINE (BENADRYL) 12.5 MG/5ML elixir 12.5 mg  12.5 mg Oral Q6H PRN Michael Boston, MD       Or   diphenhydrAMINE (BENADRYL) injection 12.5 mg  12.5 mg Intravenous Q6H PRN Michael Boston, MD       enoxaparin (LOVENOX) injection 40 mg  40 mg Subcutaneous Q24H Michael Boston, MD   40 mg at 05/05/22 0745   feeding supplement (ENSURE SURGERY) liquid 237 mL  237 mL Oral BID BM Michael Boston, MD  237 mL at 05/02/22 1355   hydrALAZINE (APRESOLINE) injection 10 mg  10 mg Intravenous Q2H PRN Michael Boston, MD       HYDROmorphone (DILAUDID) injection 0.5-2 mg  0.5-2 mg Intravenous Q4H PRN Michael Boston, MD       lip balm (CARMEX) ointment   Topical BID Michael Boston, MD   1 Application at [id number]   magic mouthwash  15 mL Oral QID PRN Michael Boston, MD       melatonin tablet 3 mg  3 mg Oral QHS PRN Michael Boston, MD   3 mg at 05/01/22 2115   methocarbamol (ROBAXIN) tablet 500 mg  500 mg Oral Q6H PRN Michael Boston, MD       metoprolol tartrate (LOPRESSOR) injection 5 mg  5 mg Intravenous Q6H PRN Michael Boston, MD       multivitamin with minerals tablet 1 tablet  1 tablet Oral q AM Michael Boston, MD   1 tablet at 05/05/22 0544   ondansetron (ZOFRAN) tablet 4 mg  4 mg Oral Q6H PRN Michael Boston, MD       Or   ondansetron [facility]) injection 4 mg  4 mg Intravenous Q6H PRN Michael Boston, MD   4 mg at 04/29/22 2022   polycarbophil (FIBERCON) tablet 625 mg  625 mg Oral BID Michael Boston, MD   625 mg at 05/04/22 2124   prochlorperazine (COMPAZINE) tablet 10 mg  10 mg Oral Q6H PRN Michael Boston, MD       Or   prochlorperazine (COMPAZINE) injection 5-10 mg  5-10 mg Intravenous Q6H PRN Michael Boston, MD       rosuvastatin (CRESTOR) tablet 10 mg  10 mg Oral Ardeen Fillers, MD   10 mg at 05/04/22 2124   traMADol (ULTRAM) tablet  50-100 mg  50-100 mg Oral Q6H PRN Michael Boston, MD   100 mg at 05/02/22 [id number]     Allergies  Allergen Reactions   Nsaids Other (See Comments)    Crohn's disease   Penicillins     Childhood allergy Has patient had a PCN reaction causing immediate rash, facial/tongue/throat swelling, SOB or lightheadedness with hypotension: Unknown Has patient had a PCN reaction causing severe rash involving mucus membranes or skin necrosis: Unknown Has patient had a PCN reaction that required hospitalization: Unknown Has patient had a PCN reaction occurring within the last 10 years: No If all of the above answers are "NO", then may proceed with Cephalosporin use.     Signed:   Adin Hector, MD, FACS, MASCRS Esophageal, Gastrointestinal & Colorectal Surgery Robotic and Minimally Invasive Surgery  [facility] [place] [id number], [place], [place] [address] [phone] Fax [phone] Main  CONTACT INFORMATION:  Weekday (9AM-5PM): Call [facility] main office at [phone]  Weeknight (5PM-9AM) or Weekend/Holiday: Check [email or web address] (password " [email or web address]") for General Surgery [facility] coverage  (Please, do not use SecureChat as it is not reliable communication to reach operating surgeons for immediate patient care given surgeries/outpatient duties/clinic/cross-coverage/off post-call which would lead to a delay in care.  Epic staff messaging available for outptient concerns, but may not be answered for 48 hours or more).     05/05/2022, 7:46 AM

## 2022-05-05 NOTE — Plan of Care (Signed)
Patient is stable for discharge. Discharge instructions give, all questions answered. Patient discharged to home.

## 2022-05-05 NOTE — Discharge Instructions (Signed)
SURGERY: POST OP INSTRUCTIONS (Surgery for small bowel obstruction, colon resection, etc)   ######################################################################  EAT Gradually transition to a high fiber diet with a fiber supplement over the next few days after discharge  WALK Walk an hour a day.  Control your pain to do that.    CONTROL PAIN Control pain so that you can walk, sleep, tolerate sneezing/coughing, go up/down stairs.  HAVE A BOWEL MOVEMENT DAILY Keep your bowels regular to avoid problems.  OK to try a laxative to override constipation.  OK to use an antidairrheal to slow down diarrhea.  Call if not better after 2 tries  CALL IF YOU HAVE PROBLEMS/CONCERNS Call if you are still struggling despite following these instructions. Call if you have concerns not answered by these instructions  ######################################################################   DIET Follow a light diet the first few days at home.  Start with a bland diet such as soups, liquids, starchy foods, low fat foods, etc.  If you feel full, bloated, or constipated, stay on a ful liquid or pureed/blenderized diet for a few days until you feel better and no longer constipated. Be sure to drink plenty of fluids every day to avoid getting dehydrated (feeling dizzy, not urinating, etc.). Gradually add a fiber supplement to your diet over the next week.  Gradually get back to a regular solid diet.  Avoid fast food or heavy meals the first week as you are more likely to get nauseated. It is expected for your digestive tract to need a few months to get back to normal.  It is common for your bowel movements and stools to be irregular.  You will have occasional bloating and cramping that should eventually fade away.  Until you are eating solid food normally, off all pain medications, and back to regular activities; your bowels will not be normal. Focus on eating a low-fat, high fiber diet the rest of your life  (See Getting to [place], below).  CARE of your INCISION or WOUND  It is good for closed incisions and even open wounds to be washed every day.  Shower every day.  Short baths are fine.  Wash the incisions and wounds clean with soap & water.    You may leave closed incisions open to air if it is dry.   You may cover the incision with clean gauze & replace it after your daily shower for comfort.      If you have an open wound with a wound vac, see wound vac care instructions.    ACTIVITIES as tolerated Start light daily activities --- self-care, walking, climbing stairs-- beginning the day after surgery.  Gradually increase activities as tolerated.  Control your pain to be active.  Stop when you are tired.  Ideally, walk several times a day, eventually an hour a day.   Most people are back to most day-to-day activities in a few weeks.  It takes 4-8 weeks to get back to unrestricted, intense activity. If you can walk 30 minutes without difficulty, it is safe to try more intense activity such as jogging, treadmill, bicycling, low-impact aerobics, swimming, etc. Save the most intensive and strenuous activity for last (Usually 4-8 weeks after surgery) such as sit-ups, heavy lifting, contact sports, etc.  Refrain from any intense heavy lifting or straining until you are off narcotics for pain control.  You will have off days, but things should improve week-by-week. DO NOT PUSH THROUGH PAIN.  Let pain be your guide: If it hurts to  do something, don't do it.  Pain is your body warning you to avoid that activity for another week until the pain goes down. You may drive when you are no longer taking narcotic prescription pain medication, you can comfortably wear a seatbelt, and you can safely make sudden turns/stops to protect yourself without hesitating due to pain. You may have sexual intercourse when it is comfortable. If it hurts to do something, stop.  MEDICATIONS Take your usually  prescribed home medications unless otherwise directed.   Blood thinners:  Usually you can restart any strong blood thinners after the second postoperative day.  It is OK to take aspirin right away.     If you are on strong blood thinners (warfarin/Coumadin, Plavix, Xerelto, Eliquis, Pradaxa, etc), discuss with your surgeon, medicine PCP, and/or cardiologist for instructions on when to restart the blood thinner & if blood monitoring is needed (PT/INR blood check, etc).     PAIN CONTROL Pain after surgery or related to activity is often due to strain/injury to muscle, tendon, nerves and/or incisions.  This pain is usually short-term and will improve in a few months.  To help speed the process of healing and to get back to regular activity more quickly, DO THE FOLLOWING THINGS TOGETHER: Increase activity gradually.  DO NOT PUSH THROUGH PAIN Use Ice and/or Heat Try Gentle Massage and/or Stretching Take over the counter pain medication Take Narcotic prescription pain medication for more severe pain  Good pain control = faster recovery.  It is better to take more medicine to be more active than to stay in bed all day to avoid medications.  Increase activity gradually Avoid heavy lifting at first, then increase to lifting as tolerated over the next 6 weeks. Do not "push through" the pain.  Listen to your body and avoid positions and maneuvers than reproduce the pain.  Wait a few days before trying something more intense Walking an hour a day is encouraged to help your body recover faster and more safely.  Start slowly and stop when getting sore.  If you can walk 30 minutes without stopping or pain, you can try more intense activity (running, jogging, aerobics, cycling, swimming, treadmill, sex, sports, weightlifting, etc.) Remember: If it hurts to do it, then don't do it! Use Ice and/or Heat You will have swelling and bruising around the incisions.  This will take several weeks to resolve. Ice packs  or heating pads (6-8 times a day, 30-60 minutes at a time) will help sooth soreness & bruising. Some people prefer to use ice alone, heat alone, or alternate between ice & heat.  Experiment and see what works best for you.  Consider trying ice for the first few days to help decrease swelling and bruising; then, switch to heat to help relax sore spots and speed recovery. Shower every day.  Short baths are fine.  It feels good!  Keep the incisions and wounds clean with soap & water.   Try Gentle Massage and/or Stretching Massage at the area of pain many times a day Stop if you feel pain - do not overdo it Take over the counter pain medication This helps the muscle and nerve tissues become less irritable and calm down faster Choose ONE of the following over-the-counter anti-inflammatory medications: Acetaminophen '500mg'$  tabs (Tylenol) 1-2 pills with every meal and just before bedtime (avoid if you have liver problems or if you have acetaminophen in you narcotic prescription) Naproxen '220mg'$  tabs (ex. Aleve, Naprosyn) 1-2 pills twice a day (  avoid if you have kidney, stomach, IBD, or bleeding problems) Ibuprofen '200mg'$  tabs (ex. Advil, Motrin) 3-4 pills with every meal and just before bedtime (avoid if you have kidney, stomach, IBD, or bleeding problems) Take with food/snack several times a day as directed for at least 2 weeks to help keep pain / soreness down & more manageable. Take Narcotic prescription pain medication for more severe pain A prescription for strong pain control is often given to you upon discharge (for example: oxycodone/Percocet, hydrocodone/Norco/Vicodin, or tramadol/Ultram) Take your pain medication as prescribed. Be mindful that most narcotic prescriptions contain Tylenol (acetaminophen) as well - avoid taking too much Tylenol. If you are having problems/concerns with the prescription medicine (does not control pain, nausea, vomiting, rash, itching, etc.), please call us [phone]  [phone] to see if we need to switch you to a different pain medicine that will work better for you and/or control your side effects better. If you need a refill on your pain medication, you must call the office before 4 pm and on weekdays only.  By federal law, prescriptions for narcotics cannot be called into a pharmacy.  They must be filled out on paper & picked up from our office by the patient or authorized caretaker.  Prescriptions cannot be filled after 4 pm nor on weekends.    WHEN TO CALL us [phone] Severe uncontrolled or worsening pain  Fever over 101 F (38.5 C) Concerns with the incision: Worsening pain, redness, rash/hives, swelling, bleeding, or drainage Reactions / problems with new medications (itching, rash, hives, nausea, etc.) Nausea and/or vomiting Difficulty urinating Difficulty breathing Worsening fatigue, dizziness, lightheadedness, blurred vision Other concerns If you are not getting better after two weeks or are noticing you are getting worse, contact our office [phone] for further advice.  We may need to adjust your medications, re-evaluate you in the office, send you to the emergency room, or see what other things we can do to help. The clinic staff is available to answer your questions during regular business hours (8:30am-5pm).  Please don't hesitate to call and ask to speak to one of our nurses for clinical concerns.    A surgeon from [facility] is always on call at the hospitals 24 hours/day If you have a medical emergency, go to the nearest emergency room or call 911.  FOLLOW UP in our office One the day of your discharge from the hospital (or the next business weekday), please call [facility] to set up or confirm an appointment to see your surgeon in the office for a follow-up appointment.  Usually it is 2-3 weeks after your surgery.   If you have skin staples at your incision(s), let the office know so we can set up a time  in the office for the nurse to remove them (usually around 10 days after surgery). Make sure that you call for appointments the day of discharge (or the next business weekday) from the hospital to ensure a convenient appointment time. IF YOU HAVE DISABILITY OR FAMILY LEAVE FORMS, BRING THEM TO THE OFFICE FOR PROCESSING.  DO NOT GIVE THEM TO YOUR DOCTOR.  [facility] [address] [place], [place], [place]  [address] ? [phone] - Main [phone] - [place],  [phone] - Fax [email or web address]    GETTING TO GOOD BOWEL HEALTH. It is expected for your digestive tract to need a few months to get back to normal.  It is common for your bowel  movements and stools to be irregular.  You will have occasional bloating and cramping that should eventually fade away.  Until you are eating solid food normally, off all pain medications, and back to regular activities; your bowels will not be normal.   Avoiding constipation The goal: ONE SOFT BOWEL MOVEMENT A DAY!    Drink plenty of fluids.  Choose water first. TAKE A FIBER SUPPLEMENT EVERY DAY THE REST OF YOUR LIFE During your first week back home, gradually add back a fiber supplement every day Experiment which form you can tolerate.   There are many forms such as powders, tablets, wafers, gummies, etc Psyllium bran (Metamucil), methylcellulose (Citrucel), Miralax or Glycolax, Benefiber, Flax Seed.  Adjust the dose week-by-week (1/2 dose/day to 6 doses a day) until you are moving your bowels 1-2 times a day.  Cut back the dose or try a different fiber product if it is giving you problems such as diarrhea or bloating. Sometimes a laxative is needed to help jump-start bowels if constipated until the fiber supplement can help regulate your bowels.  If you are tolerating eating & you are farting, it is okay to try a gentle laxative such as double dose MiraLax, prune juice, or Milk of Magnesia.  Avoid using  laxatives too often. Stool softeners can sometimes help counteract the constipating effects of narcotic pain medicines.  It can also cause diarrhea, so avoid using for too long. If you are still constipated despite taking fiber daily, eating solids, and a few doses of laxatives, call our office. Controlling diarrhea Try drinking liquids and eating bland foods for a few days to avoid stressing your intestines further. Avoid dairy products (especially milk & ice cream) for a short time.  The intestines often can lose the ability to digest lactose when stressed. Avoid foods that cause gassiness or bloating.  Typical foods include beans and other legumes, cabbage, broccoli, and dairy foods.  Avoid greasy, spicy, fast foods.  Every person has some sensitivity to other foods, so listen to your body and avoid those foods that trigger problems for you. Probiotics (such as active yogurt, Align, etc) may help repopulate the intestines and colon with normal bacteria and calm down a sensitive digestive tract Adding a fiber supplement gradually can help thicken stools by absorbing excess fluid and retrain the intestines to act more normally.  Slowly increase the dose over a few weeks.  Too much fiber too soon can backfire and cause cramping & bloating. It is okay to try and slow down diarrhea with a few doses of antidiarrheal medicines.   Bismuth subsalicylate (ex. Kayopectate, Pepto Bismol) for a few doses can help control diarrhea.  Avoid if pregnant.   Loperamide (Imodium) can slow down diarrhea.  Start with one tablet ('2mg'$ ) first.  Avoid if you are having fevers or severe pain.  ILEOSTOMY PATIENTS WILL HAVE CHRONIC DIARRHEA since their colon is not in use.    Drink plenty of liquids.  You will need to drink even more glasses of water/liquid a day to avoid getting dehydrated. Record output from your ileostomy.  Expect to empty the bag every 3-4 hours at first.  Most people with a permanent ileostomy empty their  bag 4-6 times at the least.   Use antidiarrheal medicine (especially Imodium) several times a day to avoid getting dehydrated.  Start with a dose at bedtime & breakfast.  Adjust up or down as needed.  Increase antidiarrheal medications as directed to avoid emptying the bag more than  8 times a day (every 3 hours). Work with your wound ostomy nurse to learn care for your ostomy.  See ostomy care instructions. TROUBLESHOOTING IRREGULAR BOWELS 1) Start with a soft & bland diet. No spicy, greasy, or fried foods.  2) Avoid gluten/wheat or dairy products from diet to see if symptoms improve. 3) Miralax 17gm or flax seed mixed in [place]. water or juice-daily. May use 2-4 times a day as needed. 4) Gas-X, Phazyme, etc. as needed for gas & bloating.  5) Prilosec (omeprazole) over-the-counter as needed 6)  Consider probiotics (Align, Activa, etc) to help calm the bowels down  Call your doctor if you are getting worse or not getting better.  Sometimes further testing (cultures, endoscopy, X-ray studies, CT scans, bloodwork, etc.) may be needed to help diagnose and treat the cause of the diarrhea. [facility], [place], [place], [place], [place]  [address] [phone] - Main.    [phone]  - Toll Free.   [phone] - Fax [email or web address]

## 2022-05-06 ENCOUNTER — Telehealth: Payer: Self-pay

## 2022-05-06 NOTE — Telephone Encounter (Signed)
     Patient  visit on 10/15  at [place]  Have you been able to follow up with your primary care physician? yes  The patient was or was not able to obtain any needed medicine or equipment. yes  Are there diet recommendations that you are having difficulty following? na  Patient expresses understanding of discharge instructions and education provided has no other needs at this time. yes     Cletis Clack [facility], [place], Care Management  [phone] [address], [place],  [address] Phone: [phone] Email: [email or web address]    

## 2022-08-18 DIAGNOSIS — K080 Exfoliation of teeth due to systemic causes: Secondary | ICD-10-CM | POA: Diagnosis not present

## 2022-09-29 DIAGNOSIS — K080 Exfoliation of teeth due to systemic causes: Secondary | ICD-10-CM | POA: Diagnosis not present

## 2022-10-19 DIAGNOSIS — K080 Exfoliation of teeth due to systemic causes: Secondary | ICD-10-CM | POA: Diagnosis not present

## 2022-12-08 DIAGNOSIS — K5000 Crohn's disease of small intestine without complications: Secondary | ICD-10-CM | POA: Diagnosis not present

## 2022-12-28 ENCOUNTER — Other Ambulatory Visit: Payer: Self-pay | Admitting: Acute Care

## 2022-12-28 DIAGNOSIS — F17210 Nicotine dependence, cigarettes, uncomplicated: Secondary | ICD-10-CM

## 2022-12-28 DIAGNOSIS — Z87891 Personal history of nicotine dependence: Secondary | ICD-10-CM

## 2022-12-28 DIAGNOSIS — Z122 Encounter for screening for malignant neoplasm of respiratory organs: Secondary | ICD-10-CM

## 2023-01-20 DIAGNOSIS — K080 Exfoliation of teeth due to systemic causes: Secondary | ICD-10-CM | POA: Diagnosis not present

## 2023-02-12 ENCOUNTER — Ambulatory Visit
Admission: RE | Admit: 2023-02-12 | Discharge: 2023-02-12 | Disposition: A | Discharge status: Home / Self Care | Payer: Medicare Other | Source: Ambulatory Visit

## 2023-02-12 DIAGNOSIS — F17210 Nicotine dependence, cigarettes, uncomplicated: Secondary | ICD-10-CM | POA: Diagnosis not present

## 2023-02-12 DIAGNOSIS — Z87891 Personal history of nicotine dependence: Secondary | ICD-10-CM

## 2023-02-12 DIAGNOSIS — Z122 Encounter for screening for malignant neoplasm of respiratory organs: Secondary | ICD-10-CM

## 2023-02-18 ENCOUNTER — Other Ambulatory Visit: Payer: Self-pay | Admitting: Acute Care

## 2023-02-18 DIAGNOSIS — Z122 Encounter for screening for malignant neoplasm of respiratory organs: Secondary | ICD-10-CM

## 2023-02-18 DIAGNOSIS — Z87891 Personal history of nicotine dependence: Secondary | ICD-10-CM

## 2023-02-18 DIAGNOSIS — F17210 Nicotine dependence, cigarettes, uncomplicated: Secondary | ICD-10-CM

## 2023-02-22 DIAGNOSIS — Z0000 Encounter for general adult medical examination without abnormal findings: Secondary | ICD-10-CM | POA: Diagnosis not present

## 2023-02-22 DIAGNOSIS — Z1331 Encounter for screening for depression: Secondary | ICD-10-CM | POA: Diagnosis not present

## 2023-03-29 DIAGNOSIS — Z125 Encounter for screening for malignant neoplasm of prostate: Secondary | ICD-10-CM | POA: Diagnosis not present

## 2023-03-29 DIAGNOSIS — I700 Atherosclerosis of aorta: Secondary | ICD-10-CM | POA: Diagnosis not present

## 2023-03-29 DIAGNOSIS — J439 Emphysema, unspecified: Secondary | ICD-10-CM | POA: Diagnosis not present

## 2023-03-29 DIAGNOSIS — E538 Deficiency of other specified B group vitamins: Secondary | ICD-10-CM | POA: Diagnosis not present

## 2023-03-29 DIAGNOSIS — R030 Elevated blood-pressure reading, without diagnosis of hypertension: Secondary | ICD-10-CM | POA: Diagnosis not present

## 2023-03-29 DIAGNOSIS — I2510 Atherosclerotic heart disease of native coronary artery without angina pectoris: Secondary | ICD-10-CM | POA: Diagnosis not present

## 2023-03-29 DIAGNOSIS — K50919 Crohn's disease, unspecified, with unspecified complications: Secondary | ICD-10-CM | POA: Diagnosis not present

## 2023-04-05 DIAGNOSIS — K080 Exfoliation of teeth due to systemic causes: Secondary | ICD-10-CM | POA: Diagnosis not present

## 2023-08-02 DIAGNOSIS — K080 Exfoliation of teeth due to systemic causes: Secondary | ICD-10-CM | POA: Diagnosis not present

## 2023-08-13 DIAGNOSIS — K5000 Crohn's disease of small intestine without complications: Secondary | ICD-10-CM | POA: Diagnosis not present

## 2023-08-19 DIAGNOSIS — K080 Exfoliation of teeth due to systemic causes: Secondary | ICD-10-CM | POA: Diagnosis not present

## 2023-09-27 DIAGNOSIS — I10 Essential (primary) hypertension: Secondary | ICD-10-CM | POA: Diagnosis not present

## 2023-09-27 DIAGNOSIS — E538 Deficiency of other specified B group vitamins: Secondary | ICD-10-CM | POA: Diagnosis not present

## 2023-09-27 DIAGNOSIS — J439 Emphysema, unspecified: Secondary | ICD-10-CM | POA: Diagnosis not present

## 2023-09-27 DIAGNOSIS — K50918 Crohn's disease, unspecified, with other complication: Secondary | ICD-10-CM | POA: Diagnosis not present

## 2023-11-29 DIAGNOSIS — K080 Exfoliation of teeth due to systemic causes: Secondary | ICD-10-CM | POA: Diagnosis not present

## 2024-01-11 DIAGNOSIS — K080 Exfoliation of teeth due to systemic causes: Secondary | ICD-10-CM | POA: Diagnosis not present

## 2024-03-03 ENCOUNTER — Other Ambulatory Visit: Payer: Self-pay | Admitting: Acute Care

## 2024-03-03 DIAGNOSIS — Z87891 Personal history of nicotine dependence: Secondary | ICD-10-CM

## 2024-03-03 DIAGNOSIS — Z122 Encounter for screening for malignant neoplasm of respiratory organs: Secondary | ICD-10-CM

## 2024-03-03 DIAGNOSIS — F17210 Nicotine dependence, cigarettes, uncomplicated: Secondary | ICD-10-CM

## 2024-03-15 ENCOUNTER — Ambulatory Visit
Admission: RE | Admit: 2024-03-15 | Discharge: 2024-03-15 | Disposition: A | Discharge status: Home / Self Care | Source: Ambulatory Visit

## 2024-03-15 DIAGNOSIS — F17210 Nicotine dependence, cigarettes, uncomplicated: Secondary | ICD-10-CM | POA: Diagnosis not present

## 2024-03-15 DIAGNOSIS — Z87891 Personal history of nicotine dependence: Secondary | ICD-10-CM

## 2024-03-15 DIAGNOSIS — Z122 Encounter for screening for malignant neoplasm of respiratory organs: Secondary | ICD-10-CM

## 2024-03-22 ENCOUNTER — Other Ambulatory Visit: Payer: Self-pay

## 2024-03-22 DIAGNOSIS — F17210 Nicotine dependence, cigarettes, uncomplicated: Secondary | ICD-10-CM

## 2024-03-22 DIAGNOSIS — Z87891 Personal history of nicotine dependence: Secondary | ICD-10-CM

## 2024-03-22 DIAGNOSIS — Z122 Encounter for screening for malignant neoplasm of respiratory organs: Secondary | ICD-10-CM

## 2024-03-30 DIAGNOSIS — K080 Exfoliation of teeth due to systemic causes: Secondary | ICD-10-CM | POA: Diagnosis not present

## 2024-04-04 DIAGNOSIS — E538 Deficiency of other specified B group vitamins: Secondary | ICD-10-CM | POA: Diagnosis not present

## 2024-04-04 DIAGNOSIS — K5000 Crohn's disease of small intestine without complications: Secondary | ICD-10-CM | POA: Diagnosis not present

## 2024-04-04 DIAGNOSIS — E611 Iron deficiency: Secondary | ICD-10-CM | POA: Diagnosis not present

## 2024-04-04 DIAGNOSIS — Z0000 Encounter for general adult medical examination without abnormal findings: Secondary | ICD-10-CM | POA: Diagnosis not present

## 2024-04-04 DIAGNOSIS — I10 Essential (primary) hypertension: Secondary | ICD-10-CM | POA: Diagnosis not present

## 2024-04-04 DIAGNOSIS — Z125 Encounter for screening for malignant neoplasm of prostate: Secondary | ICD-10-CM | POA: Diagnosis not present

## 2024-04-04 DIAGNOSIS — F17210 Nicotine dependence, cigarettes, uncomplicated: Secondary | ICD-10-CM | POA: Diagnosis not present

## 2024-04-04 DIAGNOSIS — I2510 Atherosclerotic heart disease of native coronary artery without angina pectoris: Secondary | ICD-10-CM | POA: Diagnosis not present
